# Patient Record
Sex: Female | Born: 1935 | Race: White | Hispanic: No | Marital: Married | Smoking: Former smoker
Health system: Southern US, Community
[De-identification: ages and names within clinical notes are randomized; demographics above are authoritative.]

## PROBLEM LIST (undated history)

## (undated) DIAGNOSIS — K5900 Constipation, unspecified: Secondary | ICD-10-CM

## (undated) DIAGNOSIS — R21 Rash and other nonspecific skin eruption: Secondary | ICD-10-CM

## (undated) DIAGNOSIS — I10 Essential (primary) hypertension: Secondary | ICD-10-CM

## (undated) DIAGNOSIS — L538 Other specified erythematous conditions: Secondary | ICD-10-CM

## (undated) DIAGNOSIS — M545 Low back pain: Secondary | ICD-10-CM

## (undated) DIAGNOSIS — R269 Unspecified abnormalities of gait and mobility: Principal | ICD-10-CM

## (undated) DIAGNOSIS — M1990 Unspecified osteoarthritis, unspecified site: Secondary | ICD-10-CM

## (undated) DIAGNOSIS — R413 Other amnesia: Secondary | ICD-10-CM

## (undated) DIAGNOSIS — M25519 Pain in unspecified shoulder: Secondary | ICD-10-CM

## (undated) DIAGNOSIS — F329 Major depressive disorder, single episode, unspecified: Secondary | ICD-10-CM

## (undated) DIAGNOSIS — J069 Acute upper respiratory infection, unspecified: Secondary | ICD-10-CM

## (undated) DIAGNOSIS — R32 Unspecified urinary incontinence: Secondary | ICD-10-CM

## (undated) DIAGNOSIS — F411 Generalized anxiety disorder: Secondary | ICD-10-CM

## (undated) DIAGNOSIS — M4800 Spinal stenosis, site unspecified: Secondary | ICD-10-CM

## (undated) DIAGNOSIS — E785 Hyperlipidemia, unspecified: Secondary | ICD-10-CM

## (undated) DIAGNOSIS — M549 Dorsalgia, unspecified: Secondary | ICD-10-CM

## (undated) DIAGNOSIS — R609 Edema, unspecified: Secondary | ICD-10-CM

## (undated) DIAGNOSIS — K210 Gastro-esophageal reflux disease with esophagitis: Secondary | ICD-10-CM

## (undated) DIAGNOSIS — E039 Hypothyroidism, unspecified: Secondary | ICD-10-CM

## (undated) HISTORY — PX: ROTATOR CUFF REPAIR: SHX139

## (undated) HISTORY — DX: Edema, unspecified: R60.9

## (undated) HISTORY — DX: Essential (primary) hypertension: I10

## (undated) HISTORY — DX: Spinal stenosis, site unspecified: M48.00

## (undated) HISTORY — DX: Unspecified abnormalities of gait and mobility: R26.9

## (undated) HISTORY — DX: Hyperlipidemia, unspecified: E78.5

## (undated) HISTORY — DX: Other specified erythematous conditions: L53.8

## (undated) HISTORY — DX: Low back pain: M54.5

## (undated) HISTORY — DX: Hypothyroidism, unspecified: E03.9

## (undated) HISTORY — DX: Gastro-esophageal reflux disease with esophagitis: K21.0

## (undated) HISTORY — DX: Generalized anxiety disorder: F41.1

## (undated) HISTORY — DX: Pain in unspecified shoulder: M25.519

## (undated) HISTORY — DX: Constipation, unspecified: K59.00

## (undated) HISTORY — PX: BACK SURGERY: SHX140

## (undated) HISTORY — DX: Unspecified osteoarthritis, unspecified site: M19.90

## (undated) HISTORY — DX: Unspecified urinary incontinence: R32

## (undated) HISTORY — DX: Rash and other nonspecific skin eruption: R21

## (undated) HISTORY — DX: Dorsalgia, unspecified: M54.9

## (undated) HISTORY — DX: Acute upper respiratory infection, unspecified: J06.9

## (undated) HISTORY — DX: Other amnesia: R41.3

## (undated) HISTORY — DX: Major depressive disorder, single episode, unspecified: F32.9

## (undated) HISTORY — PX: CATARACT EXTRACTION W/ INTRAOCULAR LENS  IMPLANT, BILATERAL: SHX1307

---

## 1970-03-15 HISTORY — PX: TOTAL ABDOMINAL HYSTERECTOMY W/ BILATERAL SALPINGOOPHORECTOMY: SHX83

## 2008-04-24 ENCOUNTER — Ambulatory Visit (HOSPITAL_COMMUNITY): Admission: RE | Admit: 2008-04-24 | Discharge: 2008-04-24 | Payer: Self-pay | Admitting: Internal Medicine

## 2008-12-25 ENCOUNTER — Encounter: Admission: RE | Admit: 2008-12-25 | Discharge: 2008-12-25 | Payer: Self-pay | Admitting: Internal Medicine

## 2009-01-27 ENCOUNTER — Ambulatory Visit (HOSPITAL_COMMUNITY): Admission: RE | Admit: 2009-01-27 | Discharge: 2009-01-27 | Payer: Self-pay | Admitting: Internal Medicine

## 2009-01-27 ENCOUNTER — Other Ambulatory Visit: Payer: Self-pay | Admitting: Internal Medicine

## 2009-12-05 ENCOUNTER — Encounter: Admission: RE | Admit: 2009-12-05 | Discharge: 2009-12-05 | Payer: Self-pay | Admitting: Internal Medicine

## 2009-12-10 ENCOUNTER — Encounter: Admission: RE | Admit: 2009-12-10 | Discharge: 2009-12-10 | Payer: Self-pay | Admitting: Internal Medicine

## 2010-01-27 ENCOUNTER — Encounter: Admission: RE | Admit: 2010-01-27 | Discharge: 2010-01-27 | Payer: Self-pay | Admitting: Neurosurgery

## 2010-03-20 ENCOUNTER — Encounter
Admission: RE | Admit: 2010-03-20 | Discharge: 2010-03-20 | Payer: Self-pay | Source: Home / Self Care | Attending: Internal Medicine | Admitting: Internal Medicine

## 2010-03-24 ENCOUNTER — Encounter
Admission: RE | Admit: 2010-03-24 | Discharge: 2010-03-24 | Payer: Self-pay | Source: Home / Self Care | Attending: Internal Medicine | Admitting: Internal Medicine

## 2010-06-17 LAB — BLOOD GAS, ARTERIAL
Drawn by: 242311
FIO2: 0.21 %
O2 Saturation: 96.2 %
Patient temperature: 98.6

## 2010-11-09 ENCOUNTER — Ambulatory Visit
Admission: RE | Admit: 2010-11-09 | Discharge: 2010-11-09 | Disposition: A | Discharge status: Home / Self Care | Payer: Medicare Other | Source: Ambulatory Visit | Attending: Cardiology | Admitting: Cardiology

## 2010-11-09 ENCOUNTER — Other Ambulatory Visit: Payer: Self-pay | Admitting: Cardiology

## 2010-11-09 DIAGNOSIS — R0989 Other specified symptoms and signs involving the circulatory and respiratory systems: Secondary | ICD-10-CM

## 2010-11-09 DIAGNOSIS — R0609 Other forms of dyspnea: Secondary | ICD-10-CM

## 2010-11-10 ENCOUNTER — Ambulatory Visit (INDEPENDENT_AMBULATORY_CARE_PROVIDER_SITE_OTHER): Payer: Medicare Other

## 2010-11-10 DIAGNOSIS — R0989 Other specified symptoms and signs involving the circulatory and respiratory systems: Secondary | ICD-10-CM

## 2010-11-10 DIAGNOSIS — R0609 Other forms of dyspnea: Secondary | ICD-10-CM

## 2010-11-13 NOTE — Procedures (Unsigned)
DUPLEX DEEP VENOUS EXAM - LOWER EXTREMITY  INDICATION:  Other dyspnea and respiratory abnormality.  HISTORY:  Edema:  Bilateral lower leg swelling/redness since 11/06/2010. Trauma/Surgery:  No. Pain:  Bilateral lower extremity tenderness. PE:  No. Previous DVT:  No. Anticoagulants:  No. Other:  DUPLEX EXAM:               CFV   SFV   PopV  PTV    GSV               R  L  R  L  R  L  R   L  R  L Thrombosis    o  o  o  o  o  o  o   o  o  o Spontaneous   +  +  +  +  +  +  +   +  +  + Phasic        +  +  +  +  +  +  +   +  +  + Augmentation  +  +  +  +  +  +  +   +  +  + Compressible  +  +  +  +  +  +  +   +  +  + Competent  Legend:  + - yes  o - no  p - partial  D - decreased  IMPRESSION: 1. No evidence of acute deep or superficial venous thrombosis noted in     the bilateral femoral-popliteal venous systems, based on decreased     visualization. 2. Unable to adequately visualize the bilateral calf veins due to     edema.  Preliminary findings stating the patency and compressibility of the visualized bilateral lower extremity veins were called to Dr. York Spaniel office by the sonographer and given to [facility] at 2 p.m. on 11/10/2010.   _____________________________ Janetta Hora. Fields, MD  CH/MEDQ  D:  11/11/2010  T:  11/11/2010  Job:  [id number]

## 2010-11-19 ENCOUNTER — Ambulatory Visit
Admission: RE | Admit: 2010-11-19 | Discharge: 2010-11-19 | Disposition: A | Discharge status: Home / Self Care | Payer: Medicare Other | Source: Ambulatory Visit | Attending: Internal Medicine | Admitting: Internal Medicine

## 2010-11-19 ENCOUNTER — Other Ambulatory Visit: Payer: PRIVATE HEALTH INSURANCE

## 2010-11-19 ENCOUNTER — Other Ambulatory Visit: Payer: Self-pay | Admitting: Internal Medicine

## 2010-11-19 DIAGNOSIS — R269 Unspecified abnormalities of gait and mobility: Secondary | ICD-10-CM

## 2010-11-19 DIAGNOSIS — R4182 Altered mental status, unspecified: Secondary | ICD-10-CM

## 2010-11-19 MED ORDER — IOHEXOL 300 MG/ML  SOLN
75.0000 mL | Freq: Once | INTRAMUSCULAR | Status: AC | PRN
  Administered 2010-11-19: 75 mL via INTRAVENOUS

## 2010-12-18 DIAGNOSIS — R413 Other amnesia: Secondary | ICD-10-CM

## 2010-12-18 HISTORY — DX: Other amnesia: R41.3

## 2011-03-16 DIAGNOSIS — Z9181 History of falling: Secondary | ICD-10-CM | POA: Diagnosis not present

## 2011-03-16 DIAGNOSIS — M6281 Muscle weakness (generalized): Secondary | ICD-10-CM | POA: Diagnosis not present

## 2011-03-16 DIAGNOSIS — R262 Difficulty in walking, not elsewhere classified: Secondary | ICD-10-CM | POA: Diagnosis not present

## 2011-03-16 DIAGNOSIS — R269 Unspecified abnormalities of gait and mobility: Secondary | ICD-10-CM | POA: Diagnosis not present

## 2011-03-16 DIAGNOSIS — R279 Unspecified lack of coordination: Secondary | ICD-10-CM | POA: Diagnosis not present

## 2011-03-17 DIAGNOSIS — F331 Major depressive disorder, recurrent, moderate: Secondary | ICD-10-CM | POA: Diagnosis not present

## 2011-03-17 DIAGNOSIS — R269 Unspecified abnormalities of gait and mobility: Secondary | ICD-10-CM | POA: Diagnosis not present

## 2011-03-17 DIAGNOSIS — M6281 Muscle weakness (generalized): Secondary | ICD-10-CM | POA: Diagnosis not present

## 2011-03-17 DIAGNOSIS — R279 Unspecified lack of coordination: Secondary | ICD-10-CM | POA: Diagnosis not present

## 2011-03-17 DIAGNOSIS — R262 Difficulty in walking, not elsewhere classified: Secondary | ICD-10-CM | POA: Diagnosis not present

## 2011-03-17 DIAGNOSIS — Z9181 History of falling: Secondary | ICD-10-CM | POA: Diagnosis not present

## 2011-03-18 DIAGNOSIS — M79609 Pain in unspecified limb: Secondary | ICD-10-CM | POA: Diagnosis not present

## 2011-03-18 DIAGNOSIS — B351 Tinea unguium: Secondary | ICD-10-CM | POA: Diagnosis not present

## 2011-03-19 DIAGNOSIS — Z9181 History of falling: Secondary | ICD-10-CM | POA: Diagnosis not present

## 2011-03-19 DIAGNOSIS — R279 Unspecified lack of coordination: Secondary | ICD-10-CM | POA: Diagnosis not present

## 2011-03-19 DIAGNOSIS — R262 Difficulty in walking, not elsewhere classified: Secondary | ICD-10-CM | POA: Diagnosis not present

## 2011-03-19 DIAGNOSIS — M6281 Muscle weakness (generalized): Secondary | ICD-10-CM | POA: Diagnosis not present

## 2011-03-19 DIAGNOSIS — R269 Unspecified abnormalities of gait and mobility: Secondary | ICD-10-CM | POA: Diagnosis not present

## 2011-03-22 DIAGNOSIS — R269 Unspecified abnormalities of gait and mobility: Secondary | ICD-10-CM | POA: Diagnosis not present

## 2011-03-22 DIAGNOSIS — Z9181 History of falling: Secondary | ICD-10-CM | POA: Diagnosis not present

## 2011-03-22 DIAGNOSIS — R279 Unspecified lack of coordination: Secondary | ICD-10-CM | POA: Diagnosis not present

## 2011-03-22 DIAGNOSIS — M6281 Muscle weakness (generalized): Secondary | ICD-10-CM | POA: Diagnosis not present

## 2011-03-22 DIAGNOSIS — R262 Difficulty in walking, not elsewhere classified: Secondary | ICD-10-CM | POA: Diagnosis not present

## 2011-03-23 DIAGNOSIS — Z9181 History of falling: Secondary | ICD-10-CM | POA: Diagnosis not present

## 2011-03-23 DIAGNOSIS — M6281 Muscle weakness (generalized): Secondary | ICD-10-CM | POA: Diagnosis not present

## 2011-03-23 DIAGNOSIS — R269 Unspecified abnormalities of gait and mobility: Secondary | ICD-10-CM | POA: Diagnosis not present

## 2011-03-23 DIAGNOSIS — R262 Difficulty in walking, not elsewhere classified: Secondary | ICD-10-CM | POA: Diagnosis not present

## 2011-03-23 DIAGNOSIS — R279 Unspecified lack of coordination: Secondary | ICD-10-CM | POA: Diagnosis not present

## 2011-03-24 DIAGNOSIS — R262 Difficulty in walking, not elsewhere classified: Secondary | ICD-10-CM | POA: Diagnosis not present

## 2011-03-24 DIAGNOSIS — R279 Unspecified lack of coordination: Secondary | ICD-10-CM | POA: Diagnosis not present

## 2011-03-24 DIAGNOSIS — Z9181 History of falling: Secondary | ICD-10-CM | POA: Diagnosis not present

## 2011-03-24 DIAGNOSIS — R269 Unspecified abnormalities of gait and mobility: Secondary | ICD-10-CM | POA: Diagnosis not present

## 2011-03-24 DIAGNOSIS — M6281 Muscle weakness (generalized): Secondary | ICD-10-CM | POA: Diagnosis not present

## 2011-03-25 DIAGNOSIS — R279 Unspecified lack of coordination: Secondary | ICD-10-CM | POA: Diagnosis not present

## 2011-03-25 DIAGNOSIS — M6281 Muscle weakness (generalized): Secondary | ICD-10-CM | POA: Diagnosis not present

## 2011-03-25 DIAGNOSIS — R262 Difficulty in walking, not elsewhere classified: Secondary | ICD-10-CM | POA: Diagnosis not present

## 2011-03-25 DIAGNOSIS — R269 Unspecified abnormalities of gait and mobility: Secondary | ICD-10-CM | POA: Diagnosis not present

## 2011-03-25 DIAGNOSIS — Z9181 History of falling: Secondary | ICD-10-CM | POA: Diagnosis not present

## 2011-03-29 DIAGNOSIS — F331 Major depressive disorder, recurrent, moderate: Secondary | ICD-10-CM | POA: Diagnosis not present

## 2011-03-31 DIAGNOSIS — Z9181 History of falling: Secondary | ICD-10-CM | POA: Diagnosis not present

## 2011-03-31 DIAGNOSIS — M6281 Muscle weakness (generalized): Secondary | ICD-10-CM | POA: Diagnosis not present

## 2011-03-31 DIAGNOSIS — R262 Difficulty in walking, not elsewhere classified: Secondary | ICD-10-CM | POA: Diagnosis not present

## 2011-03-31 DIAGNOSIS — R279 Unspecified lack of coordination: Secondary | ICD-10-CM | POA: Diagnosis not present

## 2011-03-31 DIAGNOSIS — R269 Unspecified abnormalities of gait and mobility: Secondary | ICD-10-CM | POA: Diagnosis not present

## 2011-04-01 DIAGNOSIS — R279 Unspecified lack of coordination: Secondary | ICD-10-CM | POA: Diagnosis not present

## 2011-04-01 DIAGNOSIS — R269 Unspecified abnormalities of gait and mobility: Secondary | ICD-10-CM | POA: Diagnosis not present

## 2011-04-01 DIAGNOSIS — M6281 Muscle weakness (generalized): Secondary | ICD-10-CM | POA: Diagnosis not present

## 2011-04-01 DIAGNOSIS — Z9181 History of falling: Secondary | ICD-10-CM | POA: Diagnosis not present

## 2011-04-01 DIAGNOSIS — R262 Difficulty in walking, not elsewhere classified: Secondary | ICD-10-CM | POA: Diagnosis not present

## 2011-04-05 DIAGNOSIS — R269 Unspecified abnormalities of gait and mobility: Secondary | ICD-10-CM | POA: Diagnosis not present

## 2011-04-05 DIAGNOSIS — Z9181 History of falling: Secondary | ICD-10-CM | POA: Diagnosis not present

## 2011-04-05 DIAGNOSIS — R262 Difficulty in walking, not elsewhere classified: Secondary | ICD-10-CM | POA: Diagnosis not present

## 2011-04-05 DIAGNOSIS — M6281 Muscle weakness (generalized): Secondary | ICD-10-CM | POA: Diagnosis not present

## 2011-04-05 DIAGNOSIS — R279 Unspecified lack of coordination: Secondary | ICD-10-CM | POA: Diagnosis not present

## 2011-04-06 DIAGNOSIS — R262 Difficulty in walking, not elsewhere classified: Secondary | ICD-10-CM | POA: Diagnosis not present

## 2011-04-06 DIAGNOSIS — R269 Unspecified abnormalities of gait and mobility: Secondary | ICD-10-CM | POA: Diagnosis not present

## 2011-04-06 DIAGNOSIS — Z9181 History of falling: Secondary | ICD-10-CM | POA: Diagnosis not present

## 2011-04-06 DIAGNOSIS — M6281 Muscle weakness (generalized): Secondary | ICD-10-CM | POA: Diagnosis not present

## 2011-04-06 DIAGNOSIS — R279 Unspecified lack of coordination: Secondary | ICD-10-CM | POA: Diagnosis not present

## 2011-04-07 DIAGNOSIS — R269 Unspecified abnormalities of gait and mobility: Secondary | ICD-10-CM | POA: Diagnosis not present

## 2011-04-07 DIAGNOSIS — Z9181 History of falling: Secondary | ICD-10-CM | POA: Diagnosis not present

## 2011-04-07 DIAGNOSIS — M6281 Muscle weakness (generalized): Secondary | ICD-10-CM | POA: Diagnosis not present

## 2011-04-07 DIAGNOSIS — R279 Unspecified lack of coordination: Secondary | ICD-10-CM | POA: Diagnosis not present

## 2011-04-07 DIAGNOSIS — R262 Difficulty in walking, not elsewhere classified: Secondary | ICD-10-CM | POA: Diagnosis not present

## 2011-04-09 DIAGNOSIS — M6281 Muscle weakness (generalized): Secondary | ICD-10-CM | POA: Diagnosis not present

## 2011-04-09 DIAGNOSIS — R269 Unspecified abnormalities of gait and mobility: Secondary | ICD-10-CM | POA: Diagnosis not present

## 2011-04-09 DIAGNOSIS — R262 Difficulty in walking, not elsewhere classified: Secondary | ICD-10-CM | POA: Diagnosis not present

## 2011-04-09 DIAGNOSIS — Z9181 History of falling: Secondary | ICD-10-CM | POA: Diagnosis not present

## 2011-04-09 DIAGNOSIS — R279 Unspecified lack of coordination: Secondary | ICD-10-CM | POA: Diagnosis not present

## 2011-04-12 DIAGNOSIS — Z9181 History of falling: Secondary | ICD-10-CM | POA: Diagnosis not present

## 2011-04-12 DIAGNOSIS — R262 Difficulty in walking, not elsewhere classified: Secondary | ICD-10-CM | POA: Diagnosis not present

## 2011-04-12 DIAGNOSIS — R269 Unspecified abnormalities of gait and mobility: Secondary | ICD-10-CM | POA: Diagnosis not present

## 2011-04-12 DIAGNOSIS — M6281 Muscle weakness (generalized): Secondary | ICD-10-CM | POA: Diagnosis not present

## 2011-04-12 DIAGNOSIS — R279 Unspecified lack of coordination: Secondary | ICD-10-CM | POA: Diagnosis not present

## 2011-04-13 DIAGNOSIS — R269 Unspecified abnormalities of gait and mobility: Secondary | ICD-10-CM | POA: Diagnosis not present

## 2011-04-13 DIAGNOSIS — R279 Unspecified lack of coordination: Secondary | ICD-10-CM | POA: Diagnosis not present

## 2011-04-13 DIAGNOSIS — R262 Difficulty in walking, not elsewhere classified: Secondary | ICD-10-CM | POA: Diagnosis not present

## 2011-04-13 DIAGNOSIS — M6281 Muscle weakness (generalized): Secondary | ICD-10-CM | POA: Diagnosis not present

## 2011-04-13 DIAGNOSIS — Z9181 History of falling: Secondary | ICD-10-CM | POA: Diagnosis not present

## 2011-04-14 DIAGNOSIS — F331 Major depressive disorder, recurrent, moderate: Secondary | ICD-10-CM | POA: Diagnosis not present

## 2011-04-14 DIAGNOSIS — F411 Generalized anxiety disorder: Secondary | ICD-10-CM | POA: Diagnosis not present

## 2011-04-15 DIAGNOSIS — R262 Difficulty in walking, not elsewhere classified: Secondary | ICD-10-CM | POA: Diagnosis not present

## 2011-04-15 DIAGNOSIS — R269 Unspecified abnormalities of gait and mobility: Secondary | ICD-10-CM | POA: Diagnosis not present

## 2011-04-15 DIAGNOSIS — R279 Unspecified lack of coordination: Secondary | ICD-10-CM | POA: Diagnosis not present

## 2011-04-15 DIAGNOSIS — Z9181 History of falling: Secondary | ICD-10-CM | POA: Diagnosis not present

## 2011-04-15 DIAGNOSIS — M6281 Muscle weakness (generalized): Secondary | ICD-10-CM | POA: Diagnosis not present

## 2011-04-16 DIAGNOSIS — M6281 Muscle weakness (generalized): Secondary | ICD-10-CM | POA: Diagnosis not present

## 2011-04-16 DIAGNOSIS — R262 Difficulty in walking, not elsewhere classified: Secondary | ICD-10-CM | POA: Diagnosis not present

## 2011-04-16 DIAGNOSIS — R269 Unspecified abnormalities of gait and mobility: Secondary | ICD-10-CM | POA: Diagnosis not present

## 2011-04-16 DIAGNOSIS — R279 Unspecified lack of coordination: Secondary | ICD-10-CM | POA: Diagnosis not present

## 2011-04-16 DIAGNOSIS — Z9181 History of falling: Secondary | ICD-10-CM | POA: Diagnosis not present

## 2011-04-19 DIAGNOSIS — Z9181 History of falling: Secondary | ICD-10-CM | POA: Diagnosis not present

## 2011-04-19 DIAGNOSIS — R269 Unspecified abnormalities of gait and mobility: Secondary | ICD-10-CM | POA: Diagnosis not present

## 2011-04-19 DIAGNOSIS — R262 Difficulty in walking, not elsewhere classified: Secondary | ICD-10-CM | POA: Diagnosis not present

## 2011-04-19 DIAGNOSIS — M6281 Muscle weakness (generalized): Secondary | ICD-10-CM | POA: Diagnosis not present

## 2011-04-19 DIAGNOSIS — R279 Unspecified lack of coordination: Secondary | ICD-10-CM | POA: Diagnosis not present

## 2011-04-20 DIAGNOSIS — R269 Unspecified abnormalities of gait and mobility: Secondary | ICD-10-CM | POA: Diagnosis not present

## 2011-04-20 DIAGNOSIS — R279 Unspecified lack of coordination: Secondary | ICD-10-CM | POA: Diagnosis not present

## 2011-04-20 DIAGNOSIS — M6281 Muscle weakness (generalized): Secondary | ICD-10-CM | POA: Diagnosis not present

## 2011-04-20 DIAGNOSIS — Z9181 History of falling: Secondary | ICD-10-CM | POA: Diagnosis not present

## 2011-04-20 DIAGNOSIS — R262 Difficulty in walking, not elsewhere classified: Secondary | ICD-10-CM | POA: Diagnosis not present

## 2011-04-22 DIAGNOSIS — R269 Unspecified abnormalities of gait and mobility: Secondary | ICD-10-CM | POA: Diagnosis not present

## 2011-04-22 DIAGNOSIS — Z9181 History of falling: Secondary | ICD-10-CM | POA: Diagnosis not present

## 2011-04-22 DIAGNOSIS — M6281 Muscle weakness (generalized): Secondary | ICD-10-CM | POA: Diagnosis not present

## 2011-04-22 DIAGNOSIS — R262 Difficulty in walking, not elsewhere classified: Secondary | ICD-10-CM | POA: Diagnosis not present

## 2011-04-22 DIAGNOSIS — R279 Unspecified lack of coordination: Secondary | ICD-10-CM | POA: Diagnosis not present

## 2011-04-27 DIAGNOSIS — R262 Difficulty in walking, not elsewhere classified: Secondary | ICD-10-CM | POA: Diagnosis not present

## 2011-04-27 DIAGNOSIS — M6281 Muscle weakness (generalized): Secondary | ICD-10-CM | POA: Diagnosis not present

## 2011-04-27 DIAGNOSIS — R279 Unspecified lack of coordination: Secondary | ICD-10-CM | POA: Diagnosis not present

## 2011-04-27 DIAGNOSIS — R269 Unspecified abnormalities of gait and mobility: Secondary | ICD-10-CM | POA: Diagnosis not present

## 2011-04-27 DIAGNOSIS — Z9181 History of falling: Secondary | ICD-10-CM | POA: Diagnosis not present

## 2011-04-28 DIAGNOSIS — R262 Difficulty in walking, not elsewhere classified: Secondary | ICD-10-CM | POA: Diagnosis not present

## 2011-04-28 DIAGNOSIS — Z9181 History of falling: Secondary | ICD-10-CM | POA: Diagnosis not present

## 2011-04-28 DIAGNOSIS — F331 Major depressive disorder, recurrent, moderate: Secondary | ICD-10-CM | POA: Diagnosis not present

## 2011-04-28 DIAGNOSIS — R269 Unspecified abnormalities of gait and mobility: Secondary | ICD-10-CM | POA: Diagnosis not present

## 2011-04-28 DIAGNOSIS — F411 Generalized anxiety disorder: Secondary | ICD-10-CM | POA: Diagnosis not present

## 2011-04-28 DIAGNOSIS — M6281 Muscle weakness (generalized): Secondary | ICD-10-CM | POA: Diagnosis not present

## 2011-04-28 DIAGNOSIS — R279 Unspecified lack of coordination: Secondary | ICD-10-CM | POA: Diagnosis not present

## 2011-04-29 DIAGNOSIS — M6281 Muscle weakness (generalized): Secondary | ICD-10-CM | POA: Diagnosis not present

## 2011-04-29 DIAGNOSIS — R262 Difficulty in walking, not elsewhere classified: Secondary | ICD-10-CM | POA: Diagnosis not present

## 2011-04-29 DIAGNOSIS — R279 Unspecified lack of coordination: Secondary | ICD-10-CM | POA: Diagnosis not present

## 2011-04-29 DIAGNOSIS — R269 Unspecified abnormalities of gait and mobility: Secondary | ICD-10-CM | POA: Diagnosis not present

## 2011-04-29 DIAGNOSIS — Z9181 History of falling: Secondary | ICD-10-CM | POA: Diagnosis not present

## 2011-04-30 DIAGNOSIS — Z9181 History of falling: Secondary | ICD-10-CM | POA: Diagnosis not present

## 2011-04-30 DIAGNOSIS — R279 Unspecified lack of coordination: Secondary | ICD-10-CM | POA: Diagnosis not present

## 2011-04-30 DIAGNOSIS — M6281 Muscle weakness (generalized): Secondary | ICD-10-CM | POA: Diagnosis not present

## 2011-04-30 DIAGNOSIS — R269 Unspecified abnormalities of gait and mobility: Secondary | ICD-10-CM | POA: Diagnosis not present

## 2011-04-30 DIAGNOSIS — R262 Difficulty in walking, not elsewhere classified: Secondary | ICD-10-CM | POA: Diagnosis not present

## 2011-05-03 DIAGNOSIS — R269 Unspecified abnormalities of gait and mobility: Secondary | ICD-10-CM | POA: Diagnosis not present

## 2011-05-03 DIAGNOSIS — R262 Difficulty in walking, not elsewhere classified: Secondary | ICD-10-CM | POA: Diagnosis not present

## 2011-05-03 DIAGNOSIS — M6281 Muscle weakness (generalized): Secondary | ICD-10-CM | POA: Diagnosis not present

## 2011-05-03 DIAGNOSIS — Z9181 History of falling: Secondary | ICD-10-CM | POA: Diagnosis not present

## 2011-05-03 DIAGNOSIS — R279 Unspecified lack of coordination: Secondary | ICD-10-CM | POA: Diagnosis not present

## 2011-05-05 DIAGNOSIS — M6281 Muscle weakness (generalized): Secondary | ICD-10-CM | POA: Diagnosis not present

## 2011-05-05 DIAGNOSIS — R269 Unspecified abnormalities of gait and mobility: Secondary | ICD-10-CM | POA: Diagnosis not present

## 2011-05-05 DIAGNOSIS — Z9181 History of falling: Secondary | ICD-10-CM | POA: Diagnosis not present

## 2011-05-05 DIAGNOSIS — R262 Difficulty in walking, not elsewhere classified: Secondary | ICD-10-CM | POA: Diagnosis not present

## 2011-05-05 DIAGNOSIS — R279 Unspecified lack of coordination: Secondary | ICD-10-CM | POA: Diagnosis not present

## 2011-05-06 DIAGNOSIS — M6281 Muscle weakness (generalized): Secondary | ICD-10-CM | POA: Diagnosis not present

## 2011-05-06 DIAGNOSIS — R279 Unspecified lack of coordination: Secondary | ICD-10-CM | POA: Diagnosis not present

## 2011-05-06 DIAGNOSIS — R269 Unspecified abnormalities of gait and mobility: Secondary | ICD-10-CM | POA: Diagnosis not present

## 2011-05-06 DIAGNOSIS — R262 Difficulty in walking, not elsewhere classified: Secondary | ICD-10-CM | POA: Diagnosis not present

## 2011-05-06 DIAGNOSIS — Z9181 History of falling: Secondary | ICD-10-CM | POA: Diagnosis not present

## 2011-05-07 DIAGNOSIS — R279 Unspecified lack of coordination: Secondary | ICD-10-CM | POA: Diagnosis not present

## 2011-05-07 DIAGNOSIS — M6281 Muscle weakness (generalized): Secondary | ICD-10-CM | POA: Diagnosis not present

## 2011-05-07 DIAGNOSIS — Z9181 History of falling: Secondary | ICD-10-CM | POA: Diagnosis not present

## 2011-05-07 DIAGNOSIS — R262 Difficulty in walking, not elsewhere classified: Secondary | ICD-10-CM | POA: Diagnosis not present

## 2011-05-07 DIAGNOSIS — R269 Unspecified abnormalities of gait and mobility: Secondary | ICD-10-CM | POA: Diagnosis not present

## 2011-05-10 DIAGNOSIS — M6281 Muscle weakness (generalized): Secondary | ICD-10-CM | POA: Diagnosis not present

## 2011-05-10 DIAGNOSIS — R269 Unspecified abnormalities of gait and mobility: Secondary | ICD-10-CM | POA: Diagnosis not present

## 2011-05-10 DIAGNOSIS — Z9181 History of falling: Secondary | ICD-10-CM | POA: Diagnosis not present

## 2011-05-10 DIAGNOSIS — R279 Unspecified lack of coordination: Secondary | ICD-10-CM | POA: Diagnosis not present

## 2011-05-10 DIAGNOSIS — R262 Difficulty in walking, not elsewhere classified: Secondary | ICD-10-CM | POA: Diagnosis not present

## 2011-05-11 DIAGNOSIS — R269 Unspecified abnormalities of gait and mobility: Secondary | ICD-10-CM | POA: Diagnosis not present

## 2011-05-11 DIAGNOSIS — R279 Unspecified lack of coordination: Secondary | ICD-10-CM | POA: Diagnosis not present

## 2011-05-11 DIAGNOSIS — Z9181 History of falling: Secondary | ICD-10-CM | POA: Diagnosis not present

## 2011-05-11 DIAGNOSIS — M6281 Muscle weakness (generalized): Secondary | ICD-10-CM | POA: Diagnosis not present

## 2011-05-11 DIAGNOSIS — R262 Difficulty in walking, not elsewhere classified: Secondary | ICD-10-CM | POA: Diagnosis not present

## 2011-05-12 DIAGNOSIS — R262 Difficulty in walking, not elsewhere classified: Secondary | ICD-10-CM | POA: Diagnosis not present

## 2011-05-12 DIAGNOSIS — Z9181 History of falling: Secondary | ICD-10-CM | POA: Diagnosis not present

## 2011-05-12 DIAGNOSIS — R279 Unspecified lack of coordination: Secondary | ICD-10-CM | POA: Diagnosis not present

## 2011-05-12 DIAGNOSIS — M6281 Muscle weakness (generalized): Secondary | ICD-10-CM | POA: Diagnosis not present

## 2011-05-12 DIAGNOSIS — R269 Unspecified abnormalities of gait and mobility: Secondary | ICD-10-CM | POA: Diagnosis not present

## 2011-05-13 DIAGNOSIS — R279 Unspecified lack of coordination: Secondary | ICD-10-CM | POA: Diagnosis not present

## 2011-05-13 DIAGNOSIS — R262 Difficulty in walking, not elsewhere classified: Secondary | ICD-10-CM | POA: Diagnosis not present

## 2011-05-13 DIAGNOSIS — Z9181 History of falling: Secondary | ICD-10-CM | POA: Diagnosis not present

## 2011-05-13 DIAGNOSIS — M6281 Muscle weakness (generalized): Secondary | ICD-10-CM | POA: Diagnosis not present

## 2011-05-13 DIAGNOSIS — R269 Unspecified abnormalities of gait and mobility: Secondary | ICD-10-CM | POA: Diagnosis not present

## 2011-05-18 DIAGNOSIS — M6281 Muscle weakness (generalized): Secondary | ICD-10-CM | POA: Diagnosis not present

## 2011-05-18 DIAGNOSIS — R279 Unspecified lack of coordination: Secondary | ICD-10-CM | POA: Diagnosis not present

## 2011-05-18 DIAGNOSIS — Z9181 History of falling: Secondary | ICD-10-CM | POA: Diagnosis not present

## 2011-05-18 DIAGNOSIS — R262 Difficulty in walking, not elsewhere classified: Secondary | ICD-10-CM | POA: Diagnosis not present

## 2011-05-18 DIAGNOSIS — R269 Unspecified abnormalities of gait and mobility: Secondary | ICD-10-CM | POA: Diagnosis not present

## 2011-05-19 DIAGNOSIS — Z9181 History of falling: Secondary | ICD-10-CM | POA: Diagnosis not present

## 2011-05-19 DIAGNOSIS — R279 Unspecified lack of coordination: Secondary | ICD-10-CM | POA: Diagnosis not present

## 2011-05-19 DIAGNOSIS — F411 Generalized anxiety disorder: Secondary | ICD-10-CM | POA: Diagnosis not present

## 2011-05-19 DIAGNOSIS — M6281 Muscle weakness (generalized): Secondary | ICD-10-CM | POA: Diagnosis not present

## 2011-05-19 DIAGNOSIS — F331 Major depressive disorder, recurrent, moderate: Secondary | ICD-10-CM | POA: Diagnosis not present

## 2011-05-19 DIAGNOSIS — R269 Unspecified abnormalities of gait and mobility: Secondary | ICD-10-CM | POA: Diagnosis not present

## 2011-05-19 DIAGNOSIS — R262 Difficulty in walking, not elsewhere classified: Secondary | ICD-10-CM | POA: Diagnosis not present

## 2011-05-21 DIAGNOSIS — R262 Difficulty in walking, not elsewhere classified: Secondary | ICD-10-CM | POA: Diagnosis not present

## 2011-05-21 DIAGNOSIS — R279 Unspecified lack of coordination: Secondary | ICD-10-CM | POA: Diagnosis not present

## 2011-05-21 DIAGNOSIS — M6281 Muscle weakness (generalized): Secondary | ICD-10-CM | POA: Diagnosis not present

## 2011-05-21 DIAGNOSIS — R269 Unspecified abnormalities of gait and mobility: Secondary | ICD-10-CM | POA: Diagnosis not present

## 2011-05-21 DIAGNOSIS — Z9181 History of falling: Secondary | ICD-10-CM | POA: Diagnosis not present

## 2011-05-26 DIAGNOSIS — R269 Unspecified abnormalities of gait and mobility: Secondary | ICD-10-CM | POA: Diagnosis not present

## 2011-05-26 DIAGNOSIS — M6281 Muscle weakness (generalized): Secondary | ICD-10-CM | POA: Diagnosis not present

## 2011-05-26 DIAGNOSIS — Z9181 History of falling: Secondary | ICD-10-CM | POA: Diagnosis not present

## 2011-05-26 DIAGNOSIS — R262 Difficulty in walking, not elsewhere classified: Secondary | ICD-10-CM | POA: Diagnosis not present

## 2011-05-26 DIAGNOSIS — R279 Unspecified lack of coordination: Secondary | ICD-10-CM | POA: Diagnosis not present

## 2011-05-27 DIAGNOSIS — R262 Difficulty in walking, not elsewhere classified: Secondary | ICD-10-CM | POA: Diagnosis not present

## 2011-05-27 DIAGNOSIS — M6281 Muscle weakness (generalized): Secondary | ICD-10-CM | POA: Diagnosis not present

## 2011-05-27 DIAGNOSIS — Z9181 History of falling: Secondary | ICD-10-CM | POA: Diagnosis not present

## 2011-05-27 DIAGNOSIS — R279 Unspecified lack of coordination: Secondary | ICD-10-CM | POA: Diagnosis not present

## 2011-05-27 DIAGNOSIS — R269 Unspecified abnormalities of gait and mobility: Secondary | ICD-10-CM | POA: Diagnosis not present

## 2011-05-28 DIAGNOSIS — R262 Difficulty in walking, not elsewhere classified: Secondary | ICD-10-CM | POA: Diagnosis not present

## 2011-05-28 DIAGNOSIS — M6281 Muscle weakness (generalized): Secondary | ICD-10-CM | POA: Diagnosis not present

## 2011-05-28 DIAGNOSIS — R269 Unspecified abnormalities of gait and mobility: Secondary | ICD-10-CM | POA: Diagnosis not present

## 2011-05-28 DIAGNOSIS — Z9181 History of falling: Secondary | ICD-10-CM | POA: Diagnosis not present

## 2011-05-28 DIAGNOSIS — R279 Unspecified lack of coordination: Secondary | ICD-10-CM | POA: Diagnosis not present

## 2011-05-31 DIAGNOSIS — M6281 Muscle weakness (generalized): Secondary | ICD-10-CM | POA: Diagnosis not present

## 2011-05-31 DIAGNOSIS — R279 Unspecified lack of coordination: Secondary | ICD-10-CM | POA: Diagnosis not present

## 2011-05-31 DIAGNOSIS — R269 Unspecified abnormalities of gait and mobility: Secondary | ICD-10-CM | POA: Diagnosis not present

## 2011-05-31 DIAGNOSIS — Z9181 History of falling: Secondary | ICD-10-CM | POA: Diagnosis not present

## 2011-05-31 DIAGNOSIS — R262 Difficulty in walking, not elsewhere classified: Secondary | ICD-10-CM | POA: Diagnosis not present

## 2011-06-01 DIAGNOSIS — M6281 Muscle weakness (generalized): Secondary | ICD-10-CM | POA: Diagnosis not present

## 2011-06-01 DIAGNOSIS — R279 Unspecified lack of coordination: Secondary | ICD-10-CM | POA: Diagnosis not present

## 2011-06-01 DIAGNOSIS — R262 Difficulty in walking, not elsewhere classified: Secondary | ICD-10-CM | POA: Diagnosis not present

## 2011-06-01 DIAGNOSIS — R269 Unspecified abnormalities of gait and mobility: Secondary | ICD-10-CM | POA: Diagnosis not present

## 2011-06-01 DIAGNOSIS — Z9181 History of falling: Secondary | ICD-10-CM | POA: Diagnosis not present

## 2011-06-02 DIAGNOSIS — R262 Difficulty in walking, not elsewhere classified: Secondary | ICD-10-CM | POA: Diagnosis not present

## 2011-06-02 DIAGNOSIS — R279 Unspecified lack of coordination: Secondary | ICD-10-CM | POA: Diagnosis not present

## 2011-06-02 DIAGNOSIS — Z9181 History of falling: Secondary | ICD-10-CM | POA: Diagnosis not present

## 2011-06-02 DIAGNOSIS — R269 Unspecified abnormalities of gait and mobility: Secondary | ICD-10-CM | POA: Diagnosis not present

## 2011-06-02 DIAGNOSIS — M6281 Muscle weakness (generalized): Secondary | ICD-10-CM | POA: Diagnosis not present

## 2011-06-03 DIAGNOSIS — B351 Tinea unguium: Secondary | ICD-10-CM | POA: Diagnosis not present

## 2011-06-03 DIAGNOSIS — M79609 Pain in unspecified limb: Secondary | ICD-10-CM | POA: Diagnosis not present

## 2011-06-04 DIAGNOSIS — R262 Difficulty in walking, not elsewhere classified: Secondary | ICD-10-CM | POA: Diagnosis not present

## 2011-06-04 DIAGNOSIS — R279 Unspecified lack of coordination: Secondary | ICD-10-CM | POA: Diagnosis not present

## 2011-06-04 DIAGNOSIS — M6281 Muscle weakness (generalized): Secondary | ICD-10-CM | POA: Diagnosis not present

## 2011-06-04 DIAGNOSIS — Z9181 History of falling: Secondary | ICD-10-CM | POA: Diagnosis not present

## 2011-06-04 DIAGNOSIS — R269 Unspecified abnormalities of gait and mobility: Secondary | ICD-10-CM | POA: Diagnosis not present

## 2011-06-16 DIAGNOSIS — Z1231 Encounter for screening mammogram for malignant neoplasm of breast: Secondary | ICD-10-CM | POA: Diagnosis not present

## 2011-06-17 DIAGNOSIS — Z9181 History of falling: Secondary | ICD-10-CM | POA: Diagnosis not present

## 2011-06-17 DIAGNOSIS — R279 Unspecified lack of coordination: Secondary | ICD-10-CM | POA: Diagnosis not present

## 2011-06-17 DIAGNOSIS — R262 Difficulty in walking, not elsewhere classified: Secondary | ICD-10-CM | POA: Diagnosis not present

## 2011-06-17 DIAGNOSIS — M6281 Muscle weakness (generalized): Secondary | ICD-10-CM | POA: Diagnosis not present

## 2011-06-17 DIAGNOSIS — R269 Unspecified abnormalities of gait and mobility: Secondary | ICD-10-CM | POA: Diagnosis not present

## 2011-06-18 DIAGNOSIS — M549 Dorsalgia, unspecified: Secondary | ICD-10-CM | POA: Diagnosis not present

## 2011-06-18 DIAGNOSIS — I10 Essential (primary) hypertension: Secondary | ICD-10-CM | POA: Diagnosis not present

## 2011-06-18 DIAGNOSIS — Z8719 Personal history of other diseases of the digestive system: Secondary | ICD-10-CM | POA: Diagnosis not present

## 2011-06-18 DIAGNOSIS — F329 Major depressive disorder, single episode, unspecified: Secondary | ICD-10-CM | POA: Diagnosis not present

## 2011-06-23 DIAGNOSIS — M6281 Muscle weakness (generalized): Secondary | ICD-10-CM | POA: Diagnosis not present

## 2011-06-23 DIAGNOSIS — Z9181 History of falling: Secondary | ICD-10-CM | POA: Diagnosis not present

## 2011-06-23 DIAGNOSIS — R279 Unspecified lack of coordination: Secondary | ICD-10-CM | POA: Diagnosis not present

## 2011-06-23 DIAGNOSIS — R269 Unspecified abnormalities of gait and mobility: Secondary | ICD-10-CM | POA: Diagnosis not present

## 2011-06-23 DIAGNOSIS — R262 Difficulty in walking, not elsewhere classified: Secondary | ICD-10-CM | POA: Diagnosis not present

## 2011-06-24 DIAGNOSIS — Z9181 History of falling: Secondary | ICD-10-CM | POA: Diagnosis not present

## 2011-06-24 DIAGNOSIS — R279 Unspecified lack of coordination: Secondary | ICD-10-CM | POA: Diagnosis not present

## 2011-06-24 DIAGNOSIS — R262 Difficulty in walking, not elsewhere classified: Secondary | ICD-10-CM | POA: Diagnosis not present

## 2011-06-24 DIAGNOSIS — M6281 Muscle weakness (generalized): Secondary | ICD-10-CM | POA: Diagnosis not present

## 2011-06-24 DIAGNOSIS — R269 Unspecified abnormalities of gait and mobility: Secondary | ICD-10-CM | POA: Diagnosis not present

## 2011-07-01 DIAGNOSIS — R279 Unspecified lack of coordination: Secondary | ICD-10-CM | POA: Diagnosis not present

## 2011-07-01 DIAGNOSIS — R269 Unspecified abnormalities of gait and mobility: Secondary | ICD-10-CM | POA: Diagnosis not present

## 2011-07-01 DIAGNOSIS — M6281 Muscle weakness (generalized): Secondary | ICD-10-CM | POA: Diagnosis not present

## 2011-07-01 DIAGNOSIS — Z9181 History of falling: Secondary | ICD-10-CM | POA: Diagnosis not present

## 2011-07-01 DIAGNOSIS — R262 Difficulty in walking, not elsewhere classified: Secondary | ICD-10-CM | POA: Diagnosis not present

## 2011-07-12 DIAGNOSIS — M6281 Muscle weakness (generalized): Secondary | ICD-10-CM | POA: Diagnosis not present

## 2011-07-12 DIAGNOSIS — R269 Unspecified abnormalities of gait and mobility: Secondary | ICD-10-CM | POA: Diagnosis not present

## 2011-07-12 DIAGNOSIS — R279 Unspecified lack of coordination: Secondary | ICD-10-CM | POA: Diagnosis not present

## 2011-07-12 DIAGNOSIS — R262 Difficulty in walking, not elsewhere classified: Secondary | ICD-10-CM | POA: Diagnosis not present

## 2011-07-12 DIAGNOSIS — Z9181 History of falling: Secondary | ICD-10-CM | POA: Diagnosis not present

## 2011-07-15 DIAGNOSIS — M6281 Muscle weakness (generalized): Secondary | ICD-10-CM | POA: Diagnosis not present

## 2011-07-15 DIAGNOSIS — R269 Unspecified abnormalities of gait and mobility: Secondary | ICD-10-CM | POA: Diagnosis not present

## 2011-07-15 DIAGNOSIS — R262 Difficulty in walking, not elsewhere classified: Secondary | ICD-10-CM | POA: Diagnosis not present

## 2011-07-15 DIAGNOSIS — Z9181 History of falling: Secondary | ICD-10-CM | POA: Diagnosis not present

## 2011-07-15 DIAGNOSIS — R279 Unspecified lack of coordination: Secondary | ICD-10-CM | POA: Diagnosis not present

## 2011-07-16 DIAGNOSIS — D239 Other benign neoplasm of skin, unspecified: Secondary | ICD-10-CM | POA: Diagnosis not present

## 2011-07-16 DIAGNOSIS — L538 Other specified erythematous conditions: Secondary | ICD-10-CM | POA: Diagnosis not present

## 2011-07-16 DIAGNOSIS — L819 Disorder of pigmentation, unspecified: Secondary | ICD-10-CM | POA: Diagnosis not present

## 2011-07-16 DIAGNOSIS — D1801 Hemangioma of skin and subcutaneous tissue: Secondary | ICD-10-CM | POA: Diagnosis not present

## 2011-09-06 DIAGNOSIS — B351 Tinea unguium: Secondary | ICD-10-CM | POA: Diagnosis not present

## 2011-09-06 DIAGNOSIS — M79609 Pain in unspecified limb: Secondary | ICD-10-CM | POA: Diagnosis not present

## 2011-09-24 DIAGNOSIS — Z9181 History of falling: Secondary | ICD-10-CM | POA: Diagnosis not present

## 2011-09-24 DIAGNOSIS — M6281 Muscle weakness (generalized): Secondary | ICD-10-CM | POA: Diagnosis not present

## 2011-09-24 DIAGNOSIS — R269 Unspecified abnormalities of gait and mobility: Secondary | ICD-10-CM | POA: Diagnosis not present

## 2011-09-24 DIAGNOSIS — R279 Unspecified lack of coordination: Secondary | ICD-10-CM | POA: Diagnosis not present

## 2011-09-27 DIAGNOSIS — Z9181 History of falling: Secondary | ICD-10-CM | POA: Diagnosis not present

## 2011-09-27 DIAGNOSIS — M6281 Muscle weakness (generalized): Secondary | ICD-10-CM | POA: Diagnosis not present

## 2011-09-27 DIAGNOSIS — R279 Unspecified lack of coordination: Secondary | ICD-10-CM | POA: Diagnosis not present

## 2011-09-27 DIAGNOSIS — R269 Unspecified abnormalities of gait and mobility: Secondary | ICD-10-CM | POA: Diagnosis not present

## 2011-09-28 DIAGNOSIS — Z9181 History of falling: Secondary | ICD-10-CM | POA: Diagnosis not present

## 2011-09-28 DIAGNOSIS — R269 Unspecified abnormalities of gait and mobility: Secondary | ICD-10-CM | POA: Diagnosis not present

## 2011-09-28 DIAGNOSIS — M6281 Muscle weakness (generalized): Secondary | ICD-10-CM | POA: Diagnosis not present

## 2011-09-28 DIAGNOSIS — R279 Unspecified lack of coordination: Secondary | ICD-10-CM | POA: Diagnosis not present

## 2011-09-30 DIAGNOSIS — R279 Unspecified lack of coordination: Secondary | ICD-10-CM | POA: Diagnosis not present

## 2011-09-30 DIAGNOSIS — Z9181 History of falling: Secondary | ICD-10-CM | POA: Diagnosis not present

## 2011-09-30 DIAGNOSIS — R269 Unspecified abnormalities of gait and mobility: Secondary | ICD-10-CM | POA: Diagnosis not present

## 2011-09-30 DIAGNOSIS — M6281 Muscle weakness (generalized): Secondary | ICD-10-CM | POA: Diagnosis not present

## 2011-10-05 DIAGNOSIS — Z9181 History of falling: Secondary | ICD-10-CM | POA: Diagnosis not present

## 2011-10-05 DIAGNOSIS — M6281 Muscle weakness (generalized): Secondary | ICD-10-CM | POA: Diagnosis not present

## 2011-10-05 DIAGNOSIS — R269 Unspecified abnormalities of gait and mobility: Secondary | ICD-10-CM | POA: Diagnosis not present

## 2011-10-05 DIAGNOSIS — R279 Unspecified lack of coordination: Secondary | ICD-10-CM | POA: Diagnosis not present

## 2011-10-06 DIAGNOSIS — Z9181 History of falling: Secondary | ICD-10-CM | POA: Diagnosis not present

## 2011-10-06 DIAGNOSIS — R269 Unspecified abnormalities of gait and mobility: Secondary | ICD-10-CM | POA: Diagnosis not present

## 2011-10-06 DIAGNOSIS — R279 Unspecified lack of coordination: Secondary | ICD-10-CM | POA: Diagnosis not present

## 2011-10-06 DIAGNOSIS — M6281 Muscle weakness (generalized): Secondary | ICD-10-CM | POA: Diagnosis not present

## 2011-10-07 DIAGNOSIS — R279 Unspecified lack of coordination: Secondary | ICD-10-CM | POA: Diagnosis not present

## 2011-10-07 DIAGNOSIS — Z9181 History of falling: Secondary | ICD-10-CM | POA: Diagnosis not present

## 2011-10-07 DIAGNOSIS — M6281 Muscle weakness (generalized): Secondary | ICD-10-CM | POA: Diagnosis not present

## 2011-10-07 DIAGNOSIS — R269 Unspecified abnormalities of gait and mobility: Secondary | ICD-10-CM | POA: Diagnosis not present

## 2011-10-11 DIAGNOSIS — M6281 Muscle weakness (generalized): Secondary | ICD-10-CM | POA: Diagnosis not present

## 2011-10-11 DIAGNOSIS — R269 Unspecified abnormalities of gait and mobility: Secondary | ICD-10-CM | POA: Diagnosis not present

## 2011-10-11 DIAGNOSIS — Z9181 History of falling: Secondary | ICD-10-CM | POA: Diagnosis not present

## 2011-10-11 DIAGNOSIS — R279 Unspecified lack of coordination: Secondary | ICD-10-CM | POA: Diagnosis not present

## 2011-10-13 DIAGNOSIS — Z9181 History of falling: Secondary | ICD-10-CM | POA: Diagnosis not present

## 2011-10-13 DIAGNOSIS — M6281 Muscle weakness (generalized): Secondary | ICD-10-CM | POA: Diagnosis not present

## 2011-10-13 DIAGNOSIS — R269 Unspecified abnormalities of gait and mobility: Secondary | ICD-10-CM | POA: Diagnosis not present

## 2011-10-13 DIAGNOSIS — R279 Unspecified lack of coordination: Secondary | ICD-10-CM | POA: Diagnosis not present

## 2011-10-14 DIAGNOSIS — Z9181 History of falling: Secondary | ICD-10-CM | POA: Diagnosis not present

## 2011-10-14 DIAGNOSIS — M6281 Muscle weakness (generalized): Secondary | ICD-10-CM | POA: Diagnosis not present

## 2011-10-14 DIAGNOSIS — R269 Unspecified abnormalities of gait and mobility: Secondary | ICD-10-CM | POA: Diagnosis not present

## 2011-10-14 DIAGNOSIS — R279 Unspecified lack of coordination: Secondary | ICD-10-CM | POA: Diagnosis not present

## 2011-10-15 DIAGNOSIS — Z9181 History of falling: Secondary | ICD-10-CM | POA: Diagnosis not present

## 2011-10-15 DIAGNOSIS — M6281 Muscle weakness (generalized): Secondary | ICD-10-CM | POA: Diagnosis not present

## 2011-10-15 DIAGNOSIS — R269 Unspecified abnormalities of gait and mobility: Secondary | ICD-10-CM | POA: Diagnosis not present

## 2011-10-15 DIAGNOSIS — R279 Unspecified lack of coordination: Secondary | ICD-10-CM | POA: Diagnosis not present

## 2011-10-20 DIAGNOSIS — Z9181 History of falling: Secondary | ICD-10-CM | POA: Diagnosis not present

## 2011-10-20 DIAGNOSIS — M6281 Muscle weakness (generalized): Secondary | ICD-10-CM | POA: Diagnosis not present

## 2011-10-20 DIAGNOSIS — R279 Unspecified lack of coordination: Secondary | ICD-10-CM | POA: Diagnosis not present

## 2011-10-20 DIAGNOSIS — R269 Unspecified abnormalities of gait and mobility: Secondary | ICD-10-CM | POA: Diagnosis not present

## 2011-10-21 DIAGNOSIS — Z9181 History of falling: Secondary | ICD-10-CM | POA: Diagnosis not present

## 2011-10-21 DIAGNOSIS — M6281 Muscle weakness (generalized): Secondary | ICD-10-CM | POA: Diagnosis not present

## 2011-10-21 DIAGNOSIS — R279 Unspecified lack of coordination: Secondary | ICD-10-CM | POA: Diagnosis not present

## 2011-10-21 DIAGNOSIS — R269 Unspecified abnormalities of gait and mobility: Secondary | ICD-10-CM | POA: Diagnosis not present

## 2011-10-22 DIAGNOSIS — R279 Unspecified lack of coordination: Secondary | ICD-10-CM | POA: Diagnosis not present

## 2011-10-22 DIAGNOSIS — R269 Unspecified abnormalities of gait and mobility: Secondary | ICD-10-CM | POA: Diagnosis not present

## 2011-10-22 DIAGNOSIS — Z9181 History of falling: Secondary | ICD-10-CM | POA: Diagnosis not present

## 2011-10-22 DIAGNOSIS — M6281 Muscle weakness (generalized): Secondary | ICD-10-CM | POA: Diagnosis not present

## 2011-10-25 DIAGNOSIS — R279 Unspecified lack of coordination: Secondary | ICD-10-CM | POA: Diagnosis not present

## 2011-10-25 DIAGNOSIS — M6281 Muscle weakness (generalized): Secondary | ICD-10-CM | POA: Diagnosis not present

## 2011-10-25 DIAGNOSIS — Z9181 History of falling: Secondary | ICD-10-CM | POA: Diagnosis not present

## 2011-10-25 DIAGNOSIS — R269 Unspecified abnormalities of gait and mobility: Secondary | ICD-10-CM | POA: Diagnosis not present

## 2011-11-01 DIAGNOSIS — R279 Unspecified lack of coordination: Secondary | ICD-10-CM | POA: Diagnosis not present

## 2011-11-01 DIAGNOSIS — R269 Unspecified abnormalities of gait and mobility: Secondary | ICD-10-CM | POA: Diagnosis not present

## 2011-11-01 DIAGNOSIS — Z9181 History of falling: Secondary | ICD-10-CM | POA: Diagnosis not present

## 2011-11-01 DIAGNOSIS — M6281 Muscle weakness (generalized): Secondary | ICD-10-CM | POA: Diagnosis not present

## 2011-11-10 DIAGNOSIS — K5900 Constipation, unspecified: Secondary | ICD-10-CM

## 2011-11-10 DIAGNOSIS — M4800 Spinal stenosis, site unspecified: Secondary | ICD-10-CM | POA: Diagnosis not present

## 2011-11-10 DIAGNOSIS — F3289 Other specified depressive episodes: Secondary | ICD-10-CM

## 2011-11-10 DIAGNOSIS — R32 Unspecified urinary incontinence: Secondary | ICD-10-CM

## 2011-11-10 DIAGNOSIS — M1990 Unspecified osteoarthritis, unspecified site: Secondary | ICD-10-CM

## 2011-11-10 DIAGNOSIS — F329 Major depressive disorder, single episode, unspecified: Secondary | ICD-10-CM | POA: Diagnosis not present

## 2011-11-10 DIAGNOSIS — M549 Dorsalgia, unspecified: Secondary | ICD-10-CM

## 2011-11-10 DIAGNOSIS — K2100 Gastro-esophageal reflux disease with esophagitis, without bleeding: Secondary | ICD-10-CM

## 2011-11-10 DIAGNOSIS — E039 Hypothyroidism, unspecified: Secondary | ICD-10-CM

## 2011-11-10 DIAGNOSIS — F411 Generalized anxiety disorder: Secondary | ICD-10-CM

## 2011-11-10 DIAGNOSIS — I10 Essential (primary) hypertension: Secondary | ICD-10-CM | POA: Diagnosis not present

## 2011-11-10 HISTORY — DX: Constipation, unspecified: K59.00

## 2011-11-10 HISTORY — DX: Hypothyroidism, unspecified: E03.9

## 2011-11-10 HISTORY — DX: Unspecified urinary incontinence: R32

## 2011-11-10 HISTORY — DX: Major depressive disorder, single episode, unspecified: F32.9

## 2011-11-10 HISTORY — DX: Unspecified osteoarthritis, unspecified site: M19.90

## 2011-11-10 HISTORY — DX: Generalized anxiety disorder: F41.1

## 2011-11-10 HISTORY — DX: Other specified depressive episodes: F32.89

## 2011-11-10 HISTORY — DX: Dorsalgia, unspecified: M54.9

## 2011-11-10 HISTORY — DX: Gastro-esophageal reflux disease with esophagitis, without bleeding: K21.00

## 2011-11-11 DIAGNOSIS — E039 Hypothyroidism, unspecified: Secondary | ICD-10-CM | POA: Diagnosis not present

## 2011-11-11 DIAGNOSIS — F329 Major depressive disorder, single episode, unspecified: Secondary | ICD-10-CM | POA: Diagnosis not present

## 2011-11-11 DIAGNOSIS — Z79899 Other long term (current) drug therapy: Secondary | ICD-10-CM | POA: Diagnosis not present

## 2011-11-11 DIAGNOSIS — I10 Essential (primary) hypertension: Secondary | ICD-10-CM | POA: Diagnosis not present

## 2011-11-22 DIAGNOSIS — F331 Major depressive disorder, recurrent, moderate: Secondary | ICD-10-CM | POA: Diagnosis not present

## 2011-11-29 DIAGNOSIS — B351 Tinea unguium: Secondary | ICD-10-CM | POA: Diagnosis not present

## 2011-11-29 DIAGNOSIS — M79609 Pain in unspecified limb: Secondary | ICD-10-CM | POA: Diagnosis not present

## 2011-11-30 DIAGNOSIS — F331 Major depressive disorder, recurrent, moderate: Secondary | ICD-10-CM | POA: Diagnosis not present

## 2011-12-06 DIAGNOSIS — F331 Major depressive disorder, recurrent, moderate: Secondary | ICD-10-CM | POA: Diagnosis not present

## 2011-12-13 DIAGNOSIS — I10 Essential (primary) hypertension: Secondary | ICD-10-CM | POA: Diagnosis not present

## 2011-12-13 DIAGNOSIS — M549 Dorsalgia, unspecified: Secondary | ICD-10-CM | POA: Diagnosis not present

## 2011-12-13 DIAGNOSIS — Z0000 Encounter for general adult medical examination without abnormal findings: Secondary | ICD-10-CM | POA: Diagnosis not present

## 2011-12-13 DIAGNOSIS — E039 Hypothyroidism, unspecified: Secondary | ICD-10-CM | POA: Diagnosis not present

## 2011-12-13 DIAGNOSIS — Z23 Encounter for immunization: Secondary | ICD-10-CM | POA: Diagnosis not present

## 2011-12-13 DIAGNOSIS — Z8719 Personal history of other diseases of the digestive system: Secondary | ICD-10-CM | POA: Diagnosis not present

## 2011-12-14 DIAGNOSIS — F331 Major depressive disorder, recurrent, moderate: Secondary | ICD-10-CM | POA: Diagnosis not present

## 2011-12-20 DIAGNOSIS — F331 Major depressive disorder, recurrent, moderate: Secondary | ICD-10-CM | POA: Diagnosis not present

## 2011-12-21 DIAGNOSIS — Z23 Encounter for immunization: Secondary | ICD-10-CM | POA: Diagnosis not present

## 2011-12-23 DIAGNOSIS — L538 Other specified erythematous conditions: Secondary | ICD-10-CM | POA: Diagnosis not present

## 2011-12-27 DIAGNOSIS — M545 Low back pain, unspecified: Secondary | ICD-10-CM

## 2011-12-27 DIAGNOSIS — R269 Unspecified abnormalities of gait and mobility: Secondary | ICD-10-CM | POA: Diagnosis not present

## 2011-12-27 DIAGNOSIS — R21 Rash and other nonspecific skin eruption: Secondary | ICD-10-CM

## 2011-12-27 DIAGNOSIS — F329 Major depressive disorder, single episode, unspecified: Secondary | ICD-10-CM | POA: Diagnosis not present

## 2011-12-27 HISTORY — DX: Low back pain, unspecified: M54.50

## 2011-12-27 HISTORY — DX: Rash and other nonspecific skin eruption: R21

## 2011-12-28 DIAGNOSIS — F331 Major depressive disorder, recurrent, moderate: Secondary | ICD-10-CM | POA: Diagnosis not present

## 2012-01-03 DIAGNOSIS — F331 Major depressive disorder, recurrent, moderate: Secondary | ICD-10-CM | POA: Diagnosis not present

## 2012-01-11 DIAGNOSIS — F331 Major depressive disorder, recurrent, moderate: Secondary | ICD-10-CM | POA: Diagnosis not present

## 2012-01-19 DIAGNOSIS — F331 Major depressive disorder, recurrent, moderate: Secondary | ICD-10-CM | POA: Diagnosis not present

## 2012-01-20 DIAGNOSIS — L538 Other specified erythematous conditions: Secondary | ICD-10-CM | POA: Diagnosis not present

## 2012-01-20 DIAGNOSIS — L259 Unspecified contact dermatitis, unspecified cause: Secondary | ICD-10-CM | POA: Diagnosis not present

## 2012-01-24 DIAGNOSIS — L538 Other specified erythematous conditions: Secondary | ICD-10-CM | POA: Diagnosis not present

## 2012-01-24 DIAGNOSIS — M545 Low back pain: Secondary | ICD-10-CM | POA: Diagnosis not present

## 2012-01-24 DIAGNOSIS — M25519 Pain in unspecified shoulder: Secondary | ICD-10-CM | POA: Diagnosis not present

## 2012-01-24 DIAGNOSIS — I339 Acute and subacute endocarditis, unspecified: Secondary | ICD-10-CM | POA: Diagnosis not present

## 2012-01-24 HISTORY — DX: Other specified erythematous conditions: L53.8

## 2012-01-25 DIAGNOSIS — F331 Major depressive disorder, recurrent, moderate: Secondary | ICD-10-CM | POA: Diagnosis not present

## 2012-02-01 DIAGNOSIS — F331 Major depressive disorder, recurrent, moderate: Secondary | ICD-10-CM | POA: Diagnosis not present

## 2012-02-04 DIAGNOSIS — M4800 Spinal stenosis, site unspecified: Secondary | ICD-10-CM | POA: Diagnosis not present

## 2012-02-09 DIAGNOSIS — R609 Edema, unspecified: Secondary | ICD-10-CM

## 2012-02-09 DIAGNOSIS — M4800 Spinal stenosis, site unspecified: Secondary | ICD-10-CM | POA: Diagnosis not present

## 2012-02-09 HISTORY — DX: Edema, unspecified: R60.9

## 2012-02-15 DIAGNOSIS — F331 Major depressive disorder, recurrent, moderate: Secondary | ICD-10-CM | POA: Diagnosis not present

## 2012-02-21 DIAGNOSIS — B351 Tinea unguium: Secondary | ICD-10-CM | POA: Diagnosis not present

## 2012-02-21 DIAGNOSIS — M79609 Pain in unspecified limb: Secondary | ICD-10-CM | POA: Diagnosis not present

## 2012-02-22 DIAGNOSIS — F331 Major depressive disorder, recurrent, moderate: Secondary | ICD-10-CM | POA: Diagnosis not present

## 2012-02-28 DIAGNOSIS — I10 Essential (primary) hypertension: Secondary | ICD-10-CM | POA: Diagnosis not present

## 2012-02-28 DIAGNOSIS — M25519 Pain in unspecified shoulder: Secondary | ICD-10-CM | POA: Diagnosis not present

## 2012-02-28 DIAGNOSIS — M4800 Spinal stenosis, site unspecified: Secondary | ICD-10-CM | POA: Diagnosis not present

## 2012-03-01 DIAGNOSIS — F331 Major depressive disorder, recurrent, moderate: Secondary | ICD-10-CM | POA: Diagnosis not present

## 2012-03-13 DIAGNOSIS — F331 Major depressive disorder, recurrent, moderate: Secondary | ICD-10-CM | POA: Diagnosis not present

## 2012-03-21 DIAGNOSIS — F331 Major depressive disorder, recurrent, moderate: Secondary | ICD-10-CM | POA: Diagnosis not present

## 2012-03-28 DIAGNOSIS — F331 Major depressive disorder, recurrent, moderate: Secondary | ICD-10-CM | POA: Diagnosis not present

## 2012-04-04 DIAGNOSIS — F331 Major depressive disorder, recurrent, moderate: Secondary | ICD-10-CM | POA: Diagnosis not present

## 2012-04-11 DIAGNOSIS — F331 Major depressive disorder, recurrent, moderate: Secondary | ICD-10-CM | POA: Diagnosis not present

## 2012-04-12 DIAGNOSIS — K5900 Constipation, unspecified: Secondary | ICD-10-CM | POA: Diagnosis not present

## 2012-04-12 DIAGNOSIS — M4800 Spinal stenosis, site unspecified: Secondary | ICD-10-CM | POA: Diagnosis not present

## 2012-04-12 DIAGNOSIS — I10 Essential (primary) hypertension: Secondary | ICD-10-CM | POA: Diagnosis not present

## 2012-04-18 DIAGNOSIS — F331 Major depressive disorder, recurrent, moderate: Secondary | ICD-10-CM | POA: Diagnosis not present

## 2012-04-28 DIAGNOSIS — J069 Acute upper respiratory infection, unspecified: Secondary | ICD-10-CM

## 2012-04-28 HISTORY — DX: Acute upper respiratory infection, unspecified: J06.9

## 2012-05-09 DIAGNOSIS — F331 Major depressive disorder, recurrent, moderate: Secondary | ICD-10-CM | POA: Diagnosis not present

## 2012-05-16 DIAGNOSIS — F331 Major depressive disorder, recurrent, moderate: Secondary | ICD-10-CM | POA: Diagnosis not present

## 2012-05-17 DIAGNOSIS — B351 Tinea unguium: Secondary | ICD-10-CM | POA: Diagnosis not present

## 2012-05-17 DIAGNOSIS — M79609 Pain in unspecified limb: Secondary | ICD-10-CM | POA: Diagnosis not present

## 2012-05-23 DIAGNOSIS — I10 Essential (primary) hypertension: Secondary | ICD-10-CM | POA: Diagnosis not present

## 2012-05-23 DIAGNOSIS — E785 Hyperlipidemia, unspecified: Secondary | ICD-10-CM | POA: Diagnosis not present

## 2012-05-23 DIAGNOSIS — F331 Major depressive disorder, recurrent, moderate: Secondary | ICD-10-CM | POA: Diagnosis not present

## 2012-05-24 DIAGNOSIS — H43399 Other vitreous opacities, unspecified eye: Secondary | ICD-10-CM | POA: Diagnosis not present

## 2012-05-24 DIAGNOSIS — H26499 Other secondary cataract, unspecified eye: Secondary | ICD-10-CM | POA: Diagnosis not present

## 2012-05-29 DIAGNOSIS — I10 Essential (primary) hypertension: Secondary | ICD-10-CM | POA: Diagnosis not present

## 2012-05-29 DIAGNOSIS — M25519 Pain in unspecified shoulder: Secondary | ICD-10-CM | POA: Diagnosis not present

## 2012-05-29 DIAGNOSIS — M4800 Spinal stenosis, site unspecified: Secondary | ICD-10-CM | POA: Diagnosis not present

## 2012-05-29 DIAGNOSIS — E039 Hypothyroidism, unspecified: Secondary | ICD-10-CM | POA: Diagnosis not present

## 2012-05-30 DIAGNOSIS — F331 Major depressive disorder, recurrent, moderate: Secondary | ICD-10-CM | POA: Diagnosis not present

## 2012-06-06 DIAGNOSIS — F331 Major depressive disorder, recurrent, moderate: Secondary | ICD-10-CM | POA: Diagnosis not present

## 2012-06-13 ENCOUNTER — Other Ambulatory Visit: Payer: Self-pay | Admitting: Geriatric Medicine

## 2012-06-13 DIAGNOSIS — F331 Major depressive disorder, recurrent, moderate: Secondary | ICD-10-CM | POA: Diagnosis not present

## 2012-06-20 DIAGNOSIS — F331 Major depressive disorder, recurrent, moderate: Secondary | ICD-10-CM | POA: Diagnosis not present

## 2012-06-27 DIAGNOSIS — F331 Major depressive disorder, recurrent, moderate: Secondary | ICD-10-CM | POA: Diagnosis not present

## 2012-07-04 DIAGNOSIS — F331 Major depressive disorder, recurrent, moderate: Secondary | ICD-10-CM | POA: Diagnosis not present

## 2012-07-04 IMAGING — CT CT HEAD WO/W CM
1 of 2 series · 13 of 30 positions shown, 17 images · IV contrast (75CC OMNI 300)
Comparison: None.

CLINICAL DATA: Gait abnormality in mental status change; history of
pituitary adenoma 30 years ago

CT HEAD WITHOUT AND WITH CONTRAST
TECHNIQUE: Contiguous axial images were obtained from the base of
the skull through the vertex without and with intravenous contrast.
Contrast: 75mL OMNIPAQUE IOHEXOL 300 MG/ML IV SOLN

[Series 32: 3d filtered head w/o · axial · non-contrast · 0.49mm/px · z∈[+34,+160]mm · 13 of 28 slices shown, 17 images]
[im 2/28  brain]
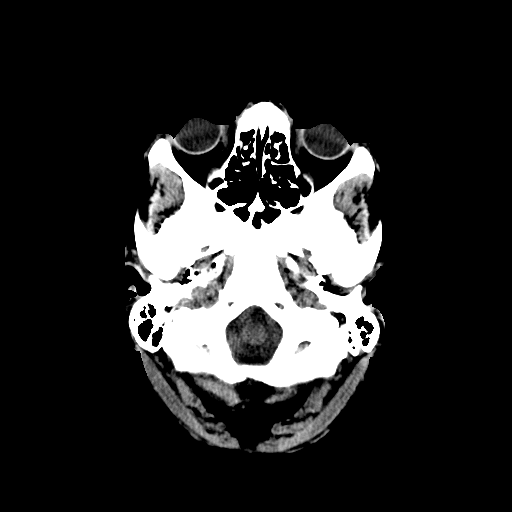
[im 2/28  bone]
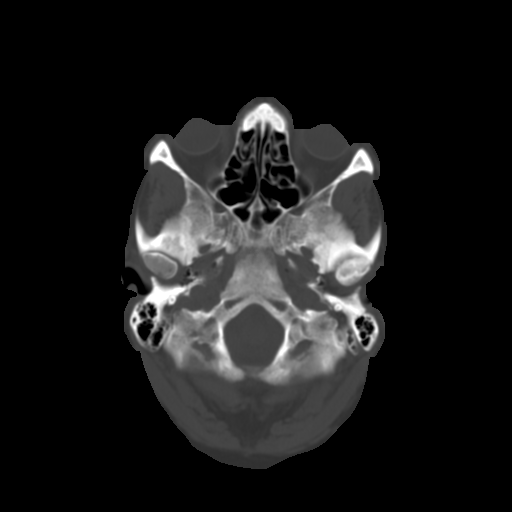
[im 4/28  brain]
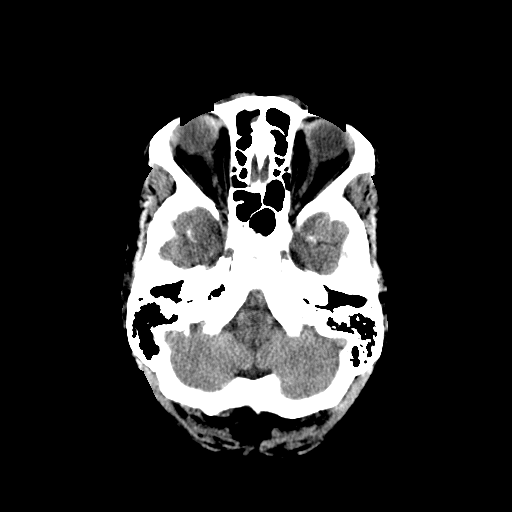
[im 6/28  brain]
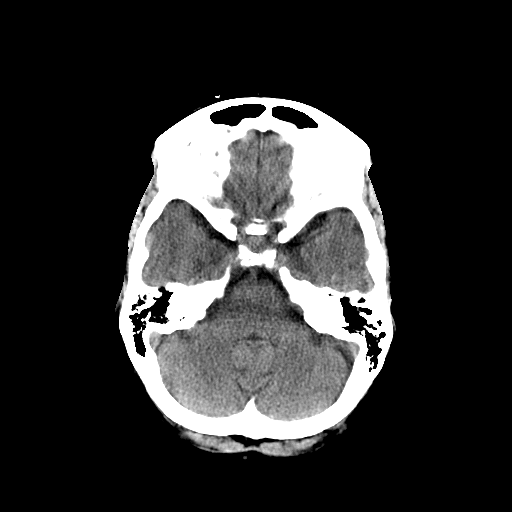
[im 8/28  brain]
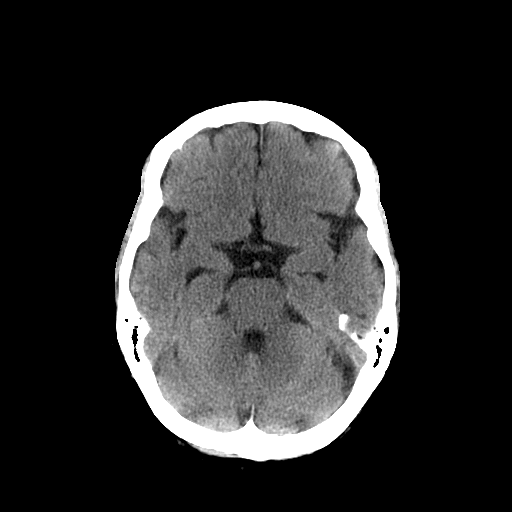
[im 10/28  brain]
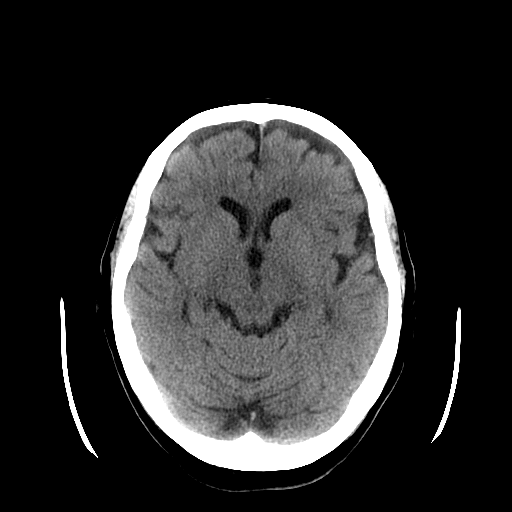
[im 10/28  bone]
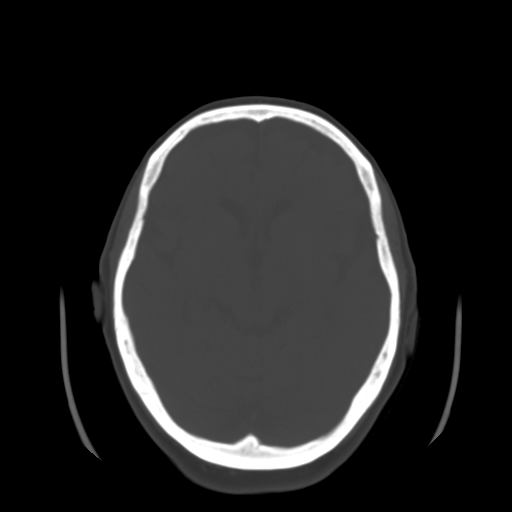
[im 12/28  brain]
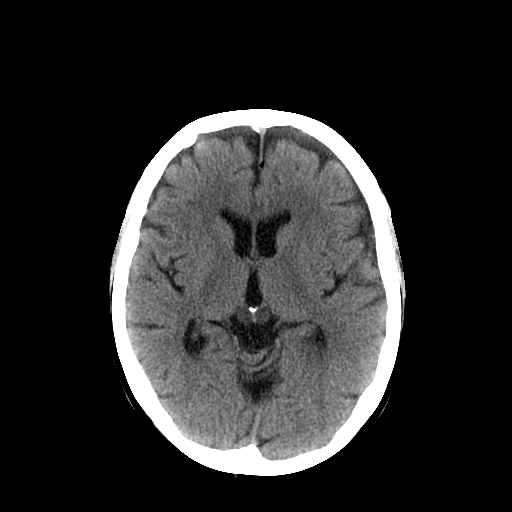
[im 14/28  brain]
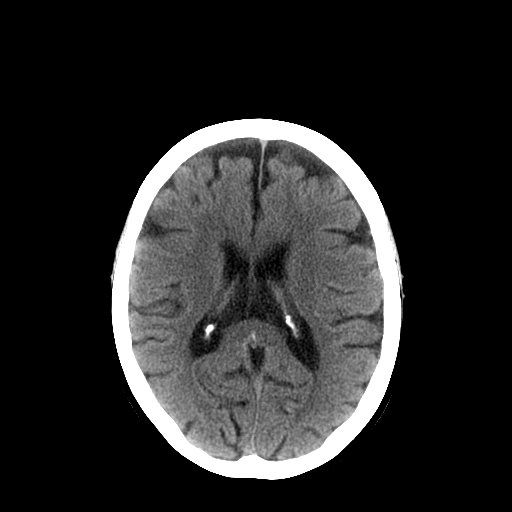
[im 16/28  brain]
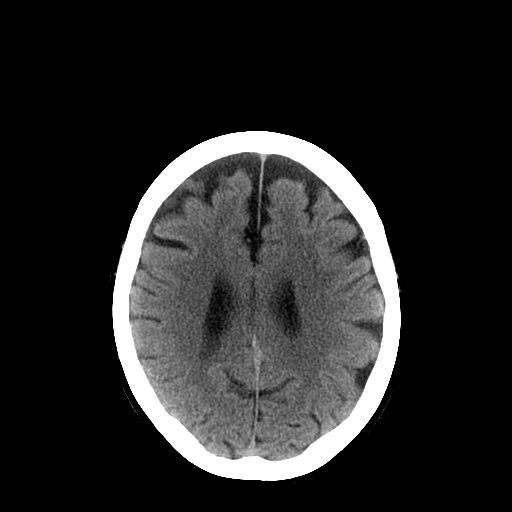
[im 18/28  brain]
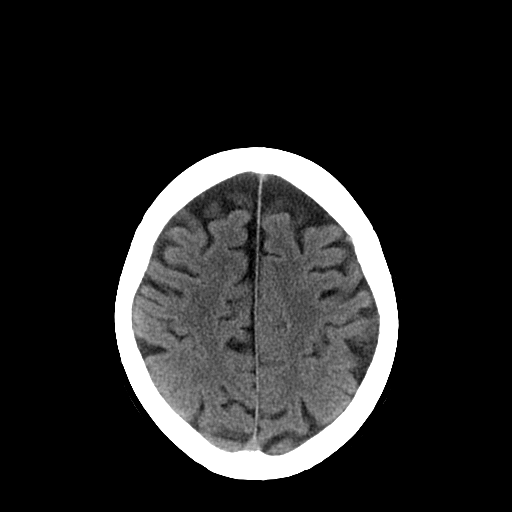
[im 18/28  bone]
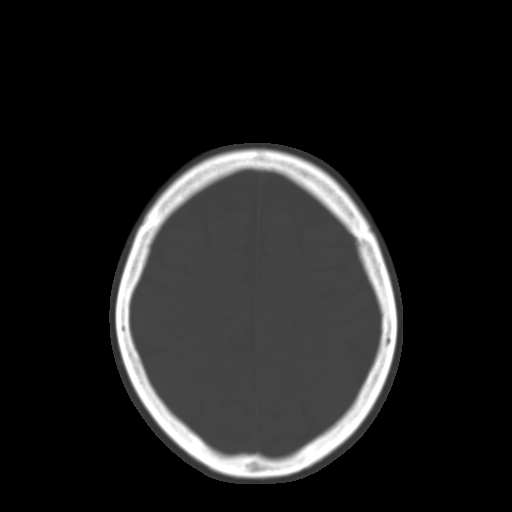
[im 20/28  brain]
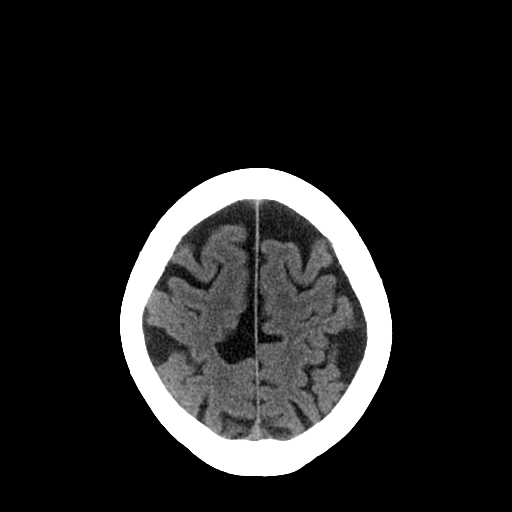
[im 22/28  brain]
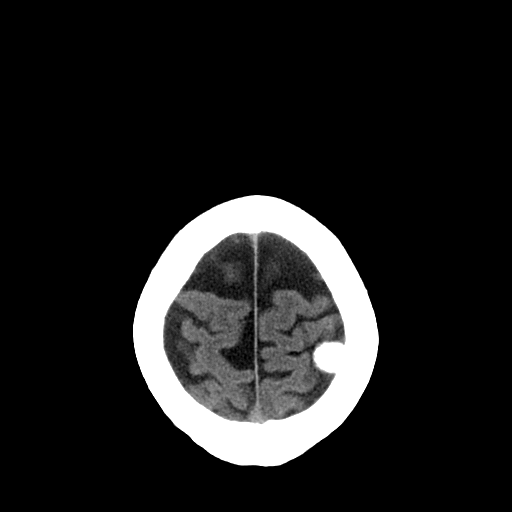
[im 24/28  brain]
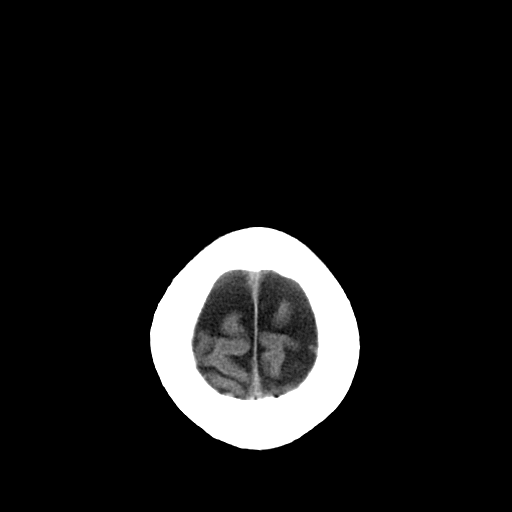
[im 26/28  brain]
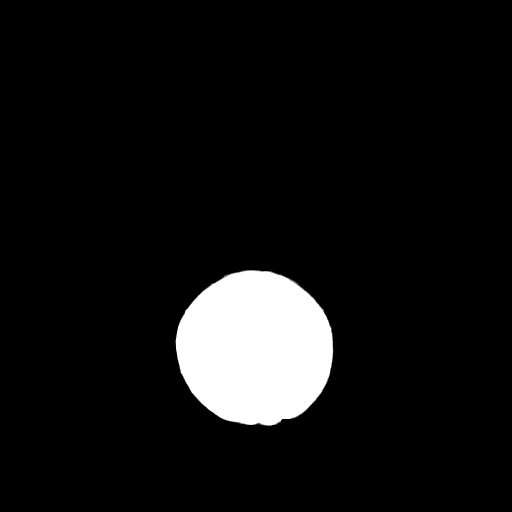
[im 26/28  bone]
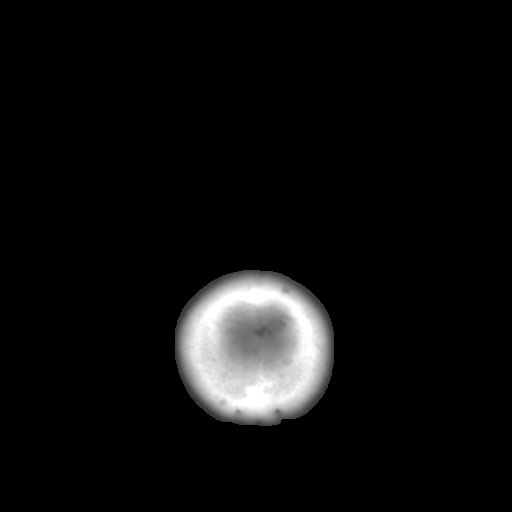

[13 of 30 positions shown; findings below may reference images not displayed]

FINDINGS: There is a 1.5 cm densely calcified extra-axial mass at
the left frontoparietal convexity, consistent with a meningioma.
There is a 6 mm similar appearing mass in the right frontal region.
There is no adjacent mass effect or edema.  The ventricles are
normal in size, shape, and position.  There is no midline shift.
No abnormal extra-axial fluid collections are identified.  There is
no acute hemorrhage.  The gray/white differentiation is normal.
There is no abnormal contrast enhancement.  The orbits, calvarium,
visualized paranasal sinuses have a normal appearance.
IMPRESSION: 1.5 cm left frontal parietal meningioma and 6 mm right frontal
meningioma.  Neither exhibit significant mass effect or adjacent
edema.

## 2012-07-11 DIAGNOSIS — F331 Major depressive disorder, recurrent, moderate: Secondary | ICD-10-CM | POA: Diagnosis not present

## 2012-07-18 DIAGNOSIS — F331 Major depressive disorder, recurrent, moderate: Secondary | ICD-10-CM | POA: Diagnosis not present

## 2012-07-19 DIAGNOSIS — L538 Other specified erythematous conditions: Secondary | ICD-10-CM | POA: Diagnosis not present

## 2012-07-19 DIAGNOSIS — I872 Venous insufficiency (chronic) (peripheral): Secondary | ICD-10-CM | POA: Diagnosis not present

## 2012-07-19 DIAGNOSIS — D239 Other benign neoplasm of skin, unspecified: Secondary | ICD-10-CM | POA: Diagnosis not present

## 2012-07-19 DIAGNOSIS — L219 Seborrheic dermatitis, unspecified: Secondary | ICD-10-CM | POA: Diagnosis not present

## 2012-07-19 DIAGNOSIS — L819 Disorder of pigmentation, unspecified: Secondary | ICD-10-CM | POA: Diagnosis not present

## 2012-07-19 DIAGNOSIS — L821 Other seborrheic keratosis: Secondary | ICD-10-CM | POA: Diagnosis not present

## 2012-07-19 DIAGNOSIS — D1801 Hemangioma of skin and subcutaneous tissue: Secondary | ICD-10-CM | POA: Diagnosis not present

## 2012-07-25 DIAGNOSIS — F331 Major depressive disorder, recurrent, moderate: Secondary | ICD-10-CM | POA: Diagnosis not present

## 2012-07-28 ENCOUNTER — Other Ambulatory Visit: Payer: Self-pay | Admitting: *Deleted

## 2012-07-28 MED ORDER — ESTRADIOL 0.5 MG PO TABS: ORAL_TABLET | ORAL | Status: DC

## 2012-08-01 DIAGNOSIS — F331 Major depressive disorder, recurrent, moderate: Secondary | ICD-10-CM | POA: Diagnosis not present

## 2012-08-08 DIAGNOSIS — F331 Major depressive disorder, recurrent, moderate: Secondary | ICD-10-CM | POA: Diagnosis not present

## 2012-08-14 ENCOUNTER — Other Ambulatory Visit: Payer: Self-pay | Admitting: Geriatric Medicine

## 2012-08-14 DIAGNOSIS — M79609 Pain in unspecified limb: Secondary | ICD-10-CM | POA: Diagnosis not present

## 2012-08-14 DIAGNOSIS — B351 Tinea unguium: Secondary | ICD-10-CM | POA: Diagnosis not present

## 2012-08-15 DIAGNOSIS — F331 Major depressive disorder, recurrent, moderate: Secondary | ICD-10-CM | POA: Diagnosis not present

## 2012-08-22 DIAGNOSIS — F331 Major depressive disorder, recurrent, moderate: Secondary | ICD-10-CM | POA: Diagnosis not present

## 2012-08-29 DIAGNOSIS — F331 Major depressive disorder, recurrent, moderate: Secondary | ICD-10-CM | POA: Diagnosis not present

## 2012-09-04 ENCOUNTER — Other Ambulatory Visit: Payer: Self-pay | Admitting: *Deleted

## 2012-09-04 MED ORDER — SITAGLIPTIN PHOSPHATE 50 MG PO TABS: ORAL_TABLET | ORAL | Status: DC

## 2012-09-04 MED ORDER — METOPROLOL SUCCINATE ER 25 MG PO TB24: ORAL_TABLET | ORAL | Status: DC

## 2012-09-04 MED ORDER — OMEPRAZOLE 20 MG PO CPDR: DELAYED_RELEASE_CAPSULE | ORAL | Status: DC

## 2012-09-05 DIAGNOSIS — Z124 Encounter for screening for malignant neoplasm of cervix: Secondary | ICD-10-CM | POA: Diagnosis not present

## 2012-09-05 DIAGNOSIS — Z13820 Encounter for screening for osteoporosis: Secondary | ICD-10-CM | POA: Diagnosis not present

## 2012-09-05 DIAGNOSIS — Z1231 Encounter for screening mammogram for malignant neoplasm of breast: Secondary | ICD-10-CM | POA: Diagnosis not present

## 2012-09-05 DIAGNOSIS — M810 Age-related osteoporosis without current pathological fracture: Secondary | ICD-10-CM | POA: Diagnosis not present

## 2012-09-13 ENCOUNTER — Encounter: Payer: Self-pay | Admitting: Geriatric Medicine

## 2012-09-13 ENCOUNTER — Other Ambulatory Visit: Payer: Self-pay | Admitting: *Deleted

## 2012-09-13 MED ORDER — LEVOTHYROXINE SODIUM 100 MCG PO TABS: ORAL_TABLET | ORAL | Status: DC

## 2012-09-19 DIAGNOSIS — F331 Major depressive disorder, recurrent, moderate: Secondary | ICD-10-CM | POA: Diagnosis not present

## 2012-09-20 ENCOUNTER — Non-Acute Institutional Stay: Payer: Medicare Other | Admitting: Geriatric Medicine

## 2012-09-20 ENCOUNTER — Encounter: Payer: Self-pay | Admitting: Geriatric Medicine

## 2012-09-20 VITALS — BP 130/68 | HR 68 | Ht <= 58 in | Wt 147.0 lb

## 2012-09-20 DIAGNOSIS — M4800 Spinal stenosis, site unspecified: Secondary | ICD-10-CM | POA: Diagnosis not present

## 2012-09-20 DIAGNOSIS — E785 Hyperlipidemia, unspecified: Secondary | ICD-10-CM

## 2012-09-20 DIAGNOSIS — I10 Essential (primary) hypertension: Secondary | ICD-10-CM | POA: Insufficient documentation

## 2012-09-20 DIAGNOSIS — E039 Hypothyroidism, unspecified: Secondary | ICD-10-CM

## 2012-09-20 DIAGNOSIS — E7849 Other hyperlipidemia: Secondary | ICD-10-CM | POA: Insufficient documentation

## 2012-09-20 DIAGNOSIS — F411 Generalized anxiety disorder: Secondary | ICD-10-CM

## 2012-09-20 NOTE — Progress Notes (Signed)
Patient ID: Kelly Irwin, female   DOB: [birth year], 79 y.o.   MRN: [id number] [facility] [phone])  Chief Complaint  Patient presents with  . Medical Managment of Chronic Issues    blood pressure, thyroid, depression    HPI: This is a 79 y.o. female resident of [facility],  Assisted Living section evaluated today for management of ongoing medical issues.  Review of record shows patient's blood pressure has been well-controlled. Patient continues to follow with Dr. Nolen Mu for management of depression and anxiety. Her medications have been adjusted recently, patient reports she's feeling better, though does report she continues to worry about her spouse's health.  Her back pain is well-controlled with current medications. Patient is able to attend exercise class 3x/times a week.     Allergies  Allergen Reactions  . Cozaar (Losartan Potassium)   . Doxycycline   . Lamictal (Lamotrigine)   . Neurontin (Gabapentin)   . Nsaids    Medications Current outpatient prescriptions:acetaminophen (TYLENOL) 325 MG tablet, Take 650 mg by mouth. Take 2 tablets every 6 hours as needed for pain, Disp: , Rfl: ;  ammonium lactate (LAC-HYDRIN) 12 % lotion, Apply topically. Apply to lower leg as needed for rash, Disp: , Rfl: ;  ARIPiprazole (ABILIFY) 2 MG tablet, Take 2 mg by mouth. Take 1/2 tablet at bedtime for depression, Disp: , Rfl:  busPIRone (BUSPAR) 5 MG tablet, Take 5 mg by mouth. Take one tablet in morning, may have extra 5mg  as needed during the day, Disp: , Rfl: ;  Calcium Carbonate-Vitamin D (CALCIUM 600+D3) 600-400 MG-UNIT per tablet, Take 1 tablet by mouth daily. Take one tablet twice daily, Disp: , Rfl: ;  estradiol (ESTRACE) 0.5 MG tablet, Take one tablet Monday and Thursday for hormone supplement, Disp: 8 tablet, Rfl: 6 levothyroxine (SYNTHROID, LEVOTHROID) 100 MCG tablet, Take one tablet once a day for tyroid supplement, Disp: 30 tablet, Rfl: 5;   LORazepam (ATIVAN) 0.5 MG tablet, Take 0.5 mg by mouth. Take one tablet at bedtime, Disp: , Rfl: ;  Melatonin 3 MG CAPS, Take by mouth. Take one capsule daily, Disp: , Rfl:  methadone (DOLOPHINE) 5 MG tablet, Take 5 mg by mouth. Take 2 tablets at 9am and 1 1/2 tablet at 1pm and 2 tablets at 9pm to treat chronic pain, Disp: , Rfl: ;  metoprolol succinate (TOPROL-XL) 25 MG 24 hr tablet, Take one tablet every day for blood pressure, Disp: 90 tablet, Rfl: 3;  Multiple Vitamins-Minerals (CARRAVITE PO), Take by mouth. Take one tablet daily, Disp: , Rfl:  nystatin-triamcinolone ointment (MYCOLOG), Apply topically. Apply daily to inflammatory folds under breast once a morning, Disp: , Rfl: ;  omeprazole (PRILOSEC) 20 MG capsule, Take one capsule twice daily to reduce stomach acid, Disp: 60 capsule, Rfl: 3;  polyethylene glycol (MIRALAX / GLYCOLAX) packet, Take 17 g by mouth daily. For constipation. Hold for loose stool, Disp: , Rfl:  sertraline (ZOLOFT) 50 MG tablet, Take 50 mg by mouth daily. For depression, Disp: , Rfl: ;  sitaGLIPtin (JANUVIA) 50 MG tablet, Take one tablet once daily to help diabetic control, Disp: 30 tablet, Rfl: 6;  talc (ZEASORB) powder, Apply topically. Apply twice daily to areas under breast and on feet, Disp: , Rfl:    DATA REVIEWED  Laboratory Studies: [facility], external 11/11/2011 CBC: Wbc 8.4, Rbc 4.83, Hgb 13.0, Hct 38.7 CMP: Sodium 139, Potassium 5.1, glucose 93, BUN 20, Creatinine 0.86 Lipid: cholesterol 246, triglyceride 180, HDL 6.0, LDL 169 TSH 2.823  05/23/2012: Glucose 84, BUN 21, creatinine 0.4, sodium 137, potassium 4.1.  Total cholesterol 233, triglyceride 144, HDL 36, LDL 151     Review of Systems  DATA OBTAINED: from patient, medical record GENERAL: Feels well   No fevers, fatigue, change in appetite or weight SKIN: No itch, rash or open wounds EYES: No eye pain, dryness or itching  No change in vision EARS: No earache, tinnitus, change in hearing NOSE: No  congestion, drainage or bleeding MOUTH/THROAT: No mouth or tooth pain  No sore throat No difficulty chewing or swallowing RESPIRATORY: No cough, wheezing, SOB CARDIAC: No chest pain, palpitations  No edema. GI: No abdominal pain  No N/V/D or constipation  No heartburn or reflux  GU: No dysuria, frequency or urgency  No change in urine volume or character  Nocturia 2-3x/night, able to go back to sleep easily   MUSCULOSKELETAL: No joint pain, swelling or stiffness   Back pain (chronic)  No muscle ache, pain, weakness  Gait is steady  No recent falls.  NEUROLOGIC: No dizziness, fainting, headache  No change in mental status.  PSYCHIATRIC: Some anxiety, depression - see HPI   Sleeps well.  No behavior issue.    Physical Exam Filed Vitals:   09/20/12 1035  BP: 130/68  Pulse: 68  Height: 4\' 9"  (1.448 m)  Weight: 147 lb (66.679 kg)   Body mass index is 31.8 kg/(m^2).  GENERAL APPEARANCE: No acute distress, appropriately groomed, Overweight body habitus. Alert, pleasant, conversant. SKIN: No diaphoresis rash, unusual lesions, wounds HEAD: Normocephalic, atraumatic EYES: Conjunctiva/lids clear. Pupils round, reactive.   EARS: External exam WNL,  Hearing grossly normal. NOSE: No deformity or discharge. MOUTH/THROAT: Lips w/o lesions. Oral mucosa, tongue moist, w/o lesion. Oropharynx w/o redness or lesions.  NECK: Supple, full ROM. No thyroid tenderness, enlargement or nodule LYMPHATICS: No head, neck or supraclavicular adenopathy RESPIRATORY: Breathing is even, unlabored. Lung sounds are clear and full.  CARDIOVASCULAR: Heart RRR. No murmur or extra heart sounds  EDEMA: No peripheral edema.  GASTROINTESTINAL: Abdomen is soft, non-tender, not distended w/ normal bowel sounds MUSCULOSKELETAL: Moves all extremities with full ROM, strength and tone. Back  with mild kyphosis, No scoliosis or spinal process tenderness. Gait is steady/walker NEUROLOGIC: Oriented to time, place, person. Speech  clear, no tremor.  PSYCHIATRIC: Mood and affect appropriate to situation  ASSESSMENT/PLAN  Spinal stenosis, unspecified region other than cervical Chronic back pain remains well controlled with current medications. She is exhibiting no  adverse effects from t.i.d. dosing of methadone. Patient continues to participate in exercise class III times a week and emulates the hallway with her walker daily.  Unspecified essential hypertension Weekly blood pressure readings remain satisfactory, range 109-139/60-70.  Unspecified hypothyroidism No signs of hypothyroidism, patient continues to take supplement daily. Update lab  Reflux esophagitis No recent complaints of reflux symptoms, patient reports that the omeprazole keeps his symptoms well and controlled. Continue current medications. Will check vitamin B12 level  Anxiety state, unspecified Patient continues with mild anxiety especially related to her husband's health. She feels the symptoms are under control with the current medications. She will continue to follow closely with Dr. Nolen Mu.    Follow up: 3 months Lab: 10/2012: CBC, CMP, TSH, B12  Shady Padron T.Aaryav Hopfensperger, NP-C 09/20/2012

## 2012-09-21 ENCOUNTER — Encounter: Payer: Self-pay | Admitting: Geriatric Medicine

## 2012-09-21 NOTE — Assessment & Plan Note (Signed)
No signs of hypothyroidism, patient continues to take supplement daily. Update lab

## 2012-09-21 NOTE — Assessment & Plan Note (Signed)
Chronic back pain remains well controlled with current medications. She is exhibiting no  adverse effects from t.i.d. dosing of methadone. Patient continues to participate in exercise class III times a week and emulates the hallway with her walker daily.

## 2012-09-21 NOTE — Assessment & Plan Note (Signed)
Patient continues with mild anxiety especially related to her husband's health. She feels the symptoms are under control with the current medications. She will continue to follow closely with Dr. Nolen Mu.

## 2012-09-21 NOTE — Assessment & Plan Note (Signed)
Weekly blood pressure readings remain satisfactory, range 109-139/60-70.

## 2012-09-21 NOTE — Assessment & Plan Note (Signed)
No recent complaints of reflux symptoms, patient reports that the omeprazole keeps his symptoms well and controlled. Continue current medications. Will check vitamin B12 level

## 2012-09-27 DIAGNOSIS — F331 Major depressive disorder, recurrent, moderate: Secondary | ICD-10-CM | POA: Diagnosis not present

## 2012-10-02 DIAGNOSIS — M6281 Muscle weakness (generalized): Secondary | ICD-10-CM | POA: Diagnosis not present

## 2012-10-02 DIAGNOSIS — R32 Unspecified urinary incontinence: Secondary | ICD-10-CM | POA: Diagnosis not present

## 2012-10-03 DIAGNOSIS — F331 Major depressive disorder, recurrent, moderate: Secondary | ICD-10-CM | POA: Diagnosis not present

## 2012-10-03 DIAGNOSIS — R32 Unspecified urinary incontinence: Secondary | ICD-10-CM | POA: Diagnosis not present

## 2012-10-03 DIAGNOSIS — M6281 Muscle weakness (generalized): Secondary | ICD-10-CM | POA: Diagnosis not present

## 2012-10-05 DIAGNOSIS — M6281 Muscle weakness (generalized): Secondary | ICD-10-CM | POA: Diagnosis not present

## 2012-10-05 DIAGNOSIS — R32 Unspecified urinary incontinence: Secondary | ICD-10-CM | POA: Diagnosis not present

## 2012-10-06 ENCOUNTER — Other Ambulatory Visit: Payer: Self-pay | Admitting: Geriatric Medicine

## 2012-10-06 MED ORDER — METHADONE HCL 5 MG PO TABS: 5.0000 mg | ORAL_TABLET | ORAL | Status: DC

## 2012-10-10 DIAGNOSIS — F331 Major depressive disorder, recurrent, moderate: Secondary | ICD-10-CM | POA: Diagnosis not present

## 2012-10-17 DIAGNOSIS — I10 Essential (primary) hypertension: Secondary | ICD-10-CM | POA: Diagnosis not present

## 2012-10-17 DIAGNOSIS — Z79899 Other long term (current) drug therapy: Secondary | ICD-10-CM | POA: Diagnosis not present

## 2012-10-17 DIAGNOSIS — E039 Hypothyroidism, unspecified: Secondary | ICD-10-CM | POA: Diagnosis not present

## 2012-10-25 DIAGNOSIS — F331 Major depressive disorder, recurrent, moderate: Secondary | ICD-10-CM | POA: Diagnosis not present

## 2012-10-26 DIAGNOSIS — R269 Unspecified abnormalities of gait and mobility: Secondary | ICD-10-CM | POA: Diagnosis not present

## 2012-10-26 DIAGNOSIS — M48061 Spinal stenosis, lumbar region without neurogenic claudication: Secondary | ICD-10-CM | POA: Diagnosis not present

## 2012-10-26 DIAGNOSIS — R279 Unspecified lack of coordination: Secondary | ICD-10-CM | POA: Diagnosis not present

## 2012-10-30 DIAGNOSIS — R279 Unspecified lack of coordination: Secondary | ICD-10-CM | POA: Diagnosis not present

## 2012-10-30 DIAGNOSIS — M48061 Spinal stenosis, lumbar region without neurogenic claudication: Secondary | ICD-10-CM | POA: Diagnosis not present

## 2012-10-30 DIAGNOSIS — R269 Unspecified abnormalities of gait and mobility: Secondary | ICD-10-CM | POA: Diagnosis not present

## 2012-10-31 DIAGNOSIS — F331 Major depressive disorder, recurrent, moderate: Secondary | ICD-10-CM | POA: Diagnosis not present

## 2012-11-01 DIAGNOSIS — R269 Unspecified abnormalities of gait and mobility: Secondary | ICD-10-CM | POA: Diagnosis not present

## 2012-11-01 DIAGNOSIS — M48061 Spinal stenosis, lumbar region without neurogenic claudication: Secondary | ICD-10-CM | POA: Diagnosis not present

## 2012-11-01 DIAGNOSIS — R279 Unspecified lack of coordination: Secondary | ICD-10-CM | POA: Diagnosis not present

## 2012-11-02 DIAGNOSIS — R279 Unspecified lack of coordination: Secondary | ICD-10-CM | POA: Diagnosis not present

## 2012-11-02 DIAGNOSIS — M48061 Spinal stenosis, lumbar region without neurogenic claudication: Secondary | ICD-10-CM | POA: Diagnosis not present

## 2012-11-02 DIAGNOSIS — R269 Unspecified abnormalities of gait and mobility: Secondary | ICD-10-CM | POA: Diagnosis not present

## 2012-11-06 ENCOUNTER — Other Ambulatory Visit: Payer: Self-pay | Admitting: Geriatric Medicine

## 2012-11-06 MED ORDER — METHADONE HCL 5 MG PO TABS: 5.0000 mg | ORAL_TABLET | ORAL | Status: DC

## 2012-11-07 DIAGNOSIS — B351 Tinea unguium: Secondary | ICD-10-CM | POA: Diagnosis not present

## 2012-11-07 DIAGNOSIS — M79609 Pain in unspecified limb: Secondary | ICD-10-CM | POA: Diagnosis not present

## 2012-11-08 DIAGNOSIS — F331 Major depressive disorder, recurrent, moderate: Secondary | ICD-10-CM | POA: Diagnosis not present

## 2012-11-09 DIAGNOSIS — R269 Unspecified abnormalities of gait and mobility: Secondary | ICD-10-CM | POA: Diagnosis not present

## 2012-11-09 DIAGNOSIS — R279 Unspecified lack of coordination: Secondary | ICD-10-CM | POA: Diagnosis not present

## 2012-11-09 DIAGNOSIS — M48061 Spinal stenosis, lumbar region without neurogenic claudication: Secondary | ICD-10-CM | POA: Diagnosis not present

## 2012-11-10 DIAGNOSIS — M48061 Spinal stenosis, lumbar region without neurogenic claudication: Secondary | ICD-10-CM | POA: Diagnosis not present

## 2012-11-10 DIAGNOSIS — R269 Unspecified abnormalities of gait and mobility: Secondary | ICD-10-CM | POA: Diagnosis not present

## 2012-11-10 DIAGNOSIS — R279 Unspecified lack of coordination: Secondary | ICD-10-CM | POA: Diagnosis not present

## 2012-11-14 DIAGNOSIS — M48061 Spinal stenosis, lumbar region without neurogenic claudication: Secondary | ICD-10-CM | POA: Diagnosis not present

## 2012-11-14 DIAGNOSIS — R269 Unspecified abnormalities of gait and mobility: Secondary | ICD-10-CM | POA: Diagnosis not present

## 2012-11-14 DIAGNOSIS — R279 Unspecified lack of coordination: Secondary | ICD-10-CM | POA: Diagnosis not present

## 2012-11-15 ENCOUNTER — Encounter: Payer: Self-pay | Admitting: Geriatric Medicine

## 2012-11-15 ENCOUNTER — Non-Acute Institutional Stay: Payer: Medicare Other | Admitting: Geriatric Medicine

## 2012-11-15 VITALS — BP 136/80 | HR 59 | Resp 13 | Ht <= 58 in | Wt 148.0 lb

## 2012-11-15 DIAGNOSIS — R269 Unspecified abnormalities of gait and mobility: Secondary | ICD-10-CM

## 2012-11-15 DIAGNOSIS — I10 Essential (primary) hypertension: Secondary | ICD-10-CM

## 2012-11-15 DIAGNOSIS — F09 Unspecified mental disorder due to known physiological condition: Secondary | ICD-10-CM

## 2012-11-15 DIAGNOSIS — E039 Hypothyroidism, unspecified: Secondary | ICD-10-CM

## 2012-11-15 DIAGNOSIS — K2100 Gastro-esophageal reflux disease with esophagitis, without bleeding: Secondary | ICD-10-CM

## 2012-11-15 DIAGNOSIS — R4189 Other symptoms and signs involving cognitive functions and awareness: Secondary | ICD-10-CM | POA: Insufficient documentation

## 2012-11-15 DIAGNOSIS — F411 Generalized anxiety disorder: Secondary | ICD-10-CM

## 2012-11-15 DIAGNOSIS — M4800 Spinal stenosis, site unspecified: Secondary | ICD-10-CM

## 2012-11-15 HISTORY — DX: Unspecified abnormalities of gait and mobility: R26.9

## 2012-11-15 NOTE — Progress Notes (Signed)
Passed Clock Drawing Test----given by Melina Modena, CMA

## 2012-11-15 NOTE — Progress Notes (Signed)
Patient ID: Kelly Irwin, female   DOB: October 22, 1927, 79 y.o.   MRN: [id number] [facility] [id number])  Code Status: DNR Contact Information   Name Relation Home Work Maguayo Spouse [phone]     Murcia,[name] Daughter [phone]       Chief Complaint  Patient presents with  . Annual Exam    Annual Exam --Patient has no Complaints    HPI: This is a 79 y.o. female resident of [facility], Assisted Living  section. This patient  has not had a hospitalization, serious illness or injury in the last year. Patient has demonstrated some decline in functional due to worsening anxiety. Requires significant time and assistance from AL staff for AM routine (dressing, grooming, personal hygeine). Spouse continues to provide assistance as well.  Patient continues weekly appointments with psychologist for counseling and regular followups with psychiatrist for psychotropic medication management. Chronic back pain due to spinal stenosis is well managed with current dose of methadone, however gait and balance have recently patient is currently participating with physical therapy interventions.  Patient's daughter has expressed some concern about this patient's memory, review of record shows this patient is forgetful at times, does not display other functional problems related to dementia. Review of record shows patient's blood pressure is well-controlled, all vital signs have been stable. Weight remains elevated. Recent laboratory studies are all satisfactory including thyroid function.  Functional Status Bathing: Maximal Assist Bladder Management: Continent, Bowel Management: Continent Feeding: Independent Hygiene and Grooming: Maximal Assist, Toileting / Clothing: Maximal Assist Walk: Moderate Independence, Walker    Allergies  Allergen Reactions  . Cozaar [Losartan Potassium]   . Doxycycline   . Lamictal [Lamotrigine]   . Neurontin  [Gabapentin]   . Nsaids    Medications    Medication List       This list is accurate as of: 11/15/12 11:59 PM.  Always use your most recent med list.               ammonium lactate 12 % lotion  Commonly known as:  LAC-HYDRIN  Apply topically. Apply to lower leg as needed for rash     ARIPiprazole 2 MG tablet  Commonly known as:  ABILIFY  Take 2 mg by mouth. Take 1/2 tablet at bedtime for depression     busPIRone 5 MG tablet  Commonly known as:  BUSPAR  Take 5 mg by mouth. Take one tablet in morning, may have extra 5mg  as needed during the day     CALCIUM 600+D3 600-400 MG-UNIT per tablet  Generic drug:  Calcium Carbonate-Vitamin D  Take 1 tablet by mouth daily. Take one tablet twice daily     CARRAVITE PO  Take by mouth. Take one tablet daily     estradiol 0.5 MG tablet  Commonly known as:  ESTRACE  Take one tablet Monday and Thursday for hormone supplement     levothyroxine 100 MCG tablet  Commonly known as:  SYNTHROID, LEVOTHROID  Take one tablet once a day for tyroid supplement     LORazepam 0.5 MG tablet  Commonly known as:  ATIVAN  Take 0.5 mg by mouth. Take one tablet at bedtime     Melatonin 3 MG Caps  Take by mouth. Take one capsule daily     methadone 5 MG tablet  Commonly known as:  DOLOPHINE  Take 1 tablet (5 mg total) by mouth as directed. Take 2 tablets at 9am and 1 1/2 tablet at 1pm  and 2 tablets at 9pm to treat chronic pain     metoprolol succinate 25 MG 24 hr tablet  Commonly known as:  TOPROL-XL  Take one tablet every day for blood pressure     nystatin-triamcinolone ointment  Commonly known as:  MYCOLOG  Apply topically. Apply daily to inflammatory folds under breast once a morning     omeprazole 20 MG capsule  Commonly known as:  PRILOSEC  Take one capsule twice daily to reduce stomach acid     polyethylene glycol packet  Commonly known as:  MIRALAX / GLYCOLAX  Take 17 g by mouth daily. For constipation. Hold for loose stool      sertraline 50 MG tablet  Commonly known as:  ZOLOFT  Take 50 mg by mouth daily. For depression     ZEASORB powder  Generic drug:  talc  Apply topically. Apply twice daily to areas under breast and on feet        DATA REVIEWED  Radiologic Exams:   [facility] List: 03/24/2010 CT Cervical spine Reversal of the normal cervical lordosis with associated 0.3 cm of anterolisthesis of C3 on C4 due to right much worse than left facet arthropathy.  Disc osteophyte complex at C5-6 results in some flattening of the ventral cord and left worse than right foraminal narrowing.  Disc bulge causes mild flattening of the ventral cord at C6-7 and mild left foraminal narrowing.            11/19/2010 CT Head 1.5 cm left frontal parietal meningioma and 6 mm right frontal meningioma. Neither exhibit significant mass effect or adjacent edema.   Cardiovascular Exams:   [facility] List:         11/10/2010 Venous Doppler No evidence of acute deep or superficial venous thrombosis noted in the bilateral femoral-popliteal venous systems, based on decreased visualization.  Unable to adequately visualize the bilateral calf veins due to edema.             Laboratory Studies:    11/11/2011 CBC: Wbc 8.4, Rbc 4.83, Hgb 13.0, Hct 38.7  CMP: Sodium 139, Potassium 5.1, glucose 93, BUN 20, Creatinine 0.86  Lipid: cholesterol 246, triglyceride 180, HDL 6.0, LDL 169  TSH 2.823   05/23/2012: Glucose 84, BUN 21, creatinine 0.4, sodium 137, potassium 4.1.  Total cholesterol 233, triglyceride 144, HDL 36, LDL 151  10/17/2012 CBC 8.5, hemoglobin 12.8, hematocrit 37.9, platelets 219  Glucose 89, BUN 15, creatinine 0.9 0.4, sodium 139, potassium 4.4. Protein/LFTs WNL.  TSH 1.075  B12 866   Past Medical History  Diagnosis Date  . Spinal stenosis, unspecified region other than cervical   . Pain in joint, shoulder region   . Unspecified essential hypertension   . Other and unspecified hyperlipidemia   . Acute upper respiratory infections  of unspecified site 04/28/2012  . Edema 02/09/2012  . Other specified erythematous condition(695.89) 01/24/2012  . Lumbago 12/27/2011  . Rash and other nonspecific skin eruption 12/27/2011  . Unspecified hypothyroidism 11/10/2011  . Anxiety state, unspecified 11/10/2011  . Depressive disorder, not elsewhere classified 11/10/2011  . Reflux esophagitis 11/10/2011  . Unspecified constipation 11/10/2011  . Osteoarthrosis, unspecified whether generalized or localized, unspecified site 11/10/2011  . Backache, unspecified 11/10/2011  . Unspecified urinary incontinence 11/10/2011  . Memory loss 12/18/2010  . Unspecified hypothyroidism    Past Surgical History  Procedure Laterality Date  . Back surgery      x3  . Rotator cuff repair Right   . Total abdominal hysterectomy w/  bilateral salpingoophorectomy  1972  . Cataract extraction w/ intraocular lens  implant, bilateral  2009-2010   Family History  Problem Relation Age of Onset  . Pneumonia Mother   . Alzheimer's disease Father    History   Social History Narrative   Patient is Married since 1950 Jake Shark).  Occupation: owned Designer, multimedia business (retitred)  Assisted Living section at [facility] since 08/[facility] . Lives with spouse.   Stopped smoking 1950s (smoked 20-3-yrs), Minimal alcohol history   Patient has  Advanced planning documents: Living Will, DNR               Review of Systems  DATA OBTAINED: from patient, medical record, family member (daughter, Elon Jester) GENERAL: Feels well   No fevers, fatigue, change in appetite or weight SKIN: No itch, rash or open wounds EYES: No eye pain, dryness or itching  No change in vision EARS: No earache, tinnitus, change in hearing NOSE: No congestion, drainage or bleeding MOUTH/THROAT: No mouth or tooth pain  No sore throat No difficulty chewing or swallowing RESPIRATORY: No cough, wheezing, SOB CARDIAC: No chest pain, palpitations  No edema. CHEST/BREASTS: No discomfort,  discharge or lumps in breasts GI: No abdominal pain  No N/V/D or constipation  No heartburn or reflux (takes medication) GU: No dysuria, frequency or urgency  No change in urine volume or character   MUSCULOSKELETAL: No joint pain, swelling or stiffness  No back pain  No muscle ache, pain, weakness  Gait is unsteady  No recent falls.  NEUROLOGIC: No dizziness, fainting, headache   No change in mental status.  PSYCHIATRIC: Has feelings of anxiety daily. Sleeps well.  No behavior issue.    Physical Exam Filed Vitals:   11/15/12 0953  BP: 136/80  Pulse: 59  Resp: 13  Height: 4\' 9"  (1.448 m)  Weight: 148 lb (67.132 kg)   Body mass index is 32.02 kg/(m^2).   GENERAL APPEARANCE: No acute distress, appropriately groomed, Obese body habitus. Alert, pleasant, conversant. SKIN: No diaphoresis, rash, wounds. Large flat, dry, dark keratotic lesions on chest (not new, unchanged) HEAD: Normocephalic, atraumatic EYES: Conjunctiva/lids clear. Pupils round, reactive. EOMs intact.  EARS: External exam WNL, canals clear, TM WNL. Hearing grossly normal. NOSE: No deformity or discharge. MOUTH/THROAT: Lips w/o lesions. Oral mucosa, tongue moist, w/o lesion. Oropharynx w/o redness or lesions.  NECK: Supple, full ROM. No thyroid tenderness, enlargement or nodule LYMPHATICS: No head, neck or supraclavicular adenopathy RESPIRATORY: Breathing is even, unlabored. Lung sounds are clear and full.  CARDIOVASCULAR: Heart RRR. No murmur or extra heart sounds  ARTERIAL: No carotid bruit. Carotid, DP pulse 2+. Femoral pulse not palpable bilateral  VENOUS: No varicosities. No venous stasis skin changes  EDEMA: No peripheral edema GASTROINTESTINAL: Abdomen is soft, non-tender, Obese w/ normal bowel sounds. No hepatic or splenic enlargement. No mass, ventral or inguinal hernia. GENITOURINARY: Bladder non tender, not distended. MUSCULOSKELETAL: decreased ROM rt. Shoulder, o/w Full ROM, strength and tone. Back is without  kyphosis, scoliosis or spinal process tenderness. Gait is unsteady NEUROLOGIC: Oriented to time, place, person. Cranial nerves 2-12 grossly intact, speech clear, Resting tremor rt. hand. Patella, brachial DTR 1+.  MMSE : 27/30, Passed Clock test  PSYCHIATRIC: Some anxiety/tearfulness re: MMSE, o/w Mood and affect appropriate to situation  ASSESSMENT/PLAN  Unspecified essential hypertension Blood pressure and pulse stable on current medications. Recent laboratory studies satisfactory.  Reflux esophagitis Reports very occasional reflux symptoms. Most recent B12 level satisfactory. Continue current medication  Unspecified hypothyroidism No signs of  overt hypothyroidism, recent TSH satisfactory, continue current supplement.  Spinal stenosis, unspecified region other than cervical Back pain management remained satisfactory with current medications. No adverse effects from methadone.  Anxiety state, unspecified Patient's anxiety is impacting her functional status. Currently assisted-living staff can manage patient's needs. Patient reports some shortness of breath related to her nerves, did review and demonstrate some breathing exercises which may be helpful. Recommend patient continues close followup with psychiatrist and psychologist, continue current medications.  Abnormality of gait Gait and balance problem multifactorial including chronic back pain and low activity level. Continue physical therapy interventions. Have recommended returning to scheduled exercise class.   Cognitive decline Nursing record shows patient is forgetful at times, daughter expresses concern with the patient's memory and general cognitive status. She feels she covers her deficit very well. MMSE today reveals mild cognitive decline. We'll continue to monitor patient and functional status. medication not indicated at this time   Follow up: 3 months  Kjirsten Bloodgood T.Jeda Pardue, NP-C 11/15/2012

## 2012-11-16 DIAGNOSIS — M48061 Spinal stenosis, lumbar region without neurogenic claudication: Secondary | ICD-10-CM | POA: Diagnosis not present

## 2012-11-16 DIAGNOSIS — R279 Unspecified lack of coordination: Secondary | ICD-10-CM | POA: Diagnosis not present

## 2012-11-16 DIAGNOSIS — R269 Unspecified abnormalities of gait and mobility: Secondary | ICD-10-CM | POA: Diagnosis not present

## 2012-11-17 DIAGNOSIS — R279 Unspecified lack of coordination: Secondary | ICD-10-CM | POA: Diagnosis not present

## 2012-11-17 DIAGNOSIS — R269 Unspecified abnormalities of gait and mobility: Secondary | ICD-10-CM | POA: Diagnosis not present

## 2012-11-17 DIAGNOSIS — M48061 Spinal stenosis, lumbar region without neurogenic claudication: Secondary | ICD-10-CM | POA: Diagnosis not present

## 2012-11-19 ENCOUNTER — Encounter: Payer: Self-pay | Admitting: Geriatric Medicine

## 2012-11-19 NOTE — Assessment & Plan Note (Signed)
Nursing record shows patient is forgetful at times, daughter expresses concern with the patient's memory and general cognitive status. She feels she covers her deficit very well. MMSE today reveals mild cognitive decline. We'll continue to monitor patient and functional status. medication not indicated at this time

## 2012-11-19 NOTE — Assessment & Plan Note (Signed)
Patient's anxiety is impacting her functional status. Currently assisted-living staff can manage patient's needs. Patient reports some shortness of breath related to her nerves, did review and demonstrate some breathing exercises which may be helpful. Recommend patient continues close followup with psychiatrist and psychologist, continue current medications.

## 2012-11-19 NOTE — Assessment & Plan Note (Signed)
No signs of overt hypothyroidism, recent TSH satisfactory, continue current supplement.

## 2012-11-19 NOTE — Assessment & Plan Note (Signed)
Reports very occasional reflux symptoms. Most recent B12 level satisfactory. Continue current medication

## 2012-11-19 NOTE — Assessment & Plan Note (Signed)
Blood pressure and pulse stable on current medications. Recent laboratory studies satisfactory.

## 2012-11-19 NOTE — Assessment & Plan Note (Signed)
Gait and balance problem multifactorial including chronic back pain and low activity level. Continue physical therapy interventions. Have recommended returning to scheduled exercise class.

## 2012-11-19 NOTE — Assessment & Plan Note (Signed)
Back pain management remained satisfactory with current medications. No adverse effects from methadone.

## 2012-11-20 DIAGNOSIS — M48061 Spinal stenosis, lumbar region without neurogenic claudication: Secondary | ICD-10-CM | POA: Diagnosis not present

## 2012-11-20 DIAGNOSIS — R279 Unspecified lack of coordination: Secondary | ICD-10-CM | POA: Diagnosis not present

## 2012-11-20 DIAGNOSIS — R269 Unspecified abnormalities of gait and mobility: Secondary | ICD-10-CM | POA: Diagnosis not present

## 2012-11-21 DIAGNOSIS — F331 Major depressive disorder, recurrent, moderate: Secondary | ICD-10-CM | POA: Diagnosis not present

## 2012-11-23 DIAGNOSIS — R279 Unspecified lack of coordination: Secondary | ICD-10-CM | POA: Diagnosis not present

## 2012-11-23 DIAGNOSIS — M48061 Spinal stenosis, lumbar region without neurogenic claudication: Secondary | ICD-10-CM | POA: Diagnosis not present

## 2012-11-23 DIAGNOSIS — R269 Unspecified abnormalities of gait and mobility: Secondary | ICD-10-CM | POA: Diagnosis not present

## 2012-11-28 DIAGNOSIS — F331 Major depressive disorder, recurrent, moderate: Secondary | ICD-10-CM | POA: Diagnosis not present

## 2012-12-05 DIAGNOSIS — F331 Major depressive disorder, recurrent, moderate: Secondary | ICD-10-CM | POA: Diagnosis not present

## 2012-12-08 ENCOUNTER — Other Ambulatory Visit: Payer: Self-pay | Admitting: Geriatric Medicine

## 2012-12-08 MED ORDER — METHADONE HCL 5 MG PO TABS: 5.0000 mg | ORAL_TABLET | ORAL | Status: DC

## 2012-12-12 DIAGNOSIS — F331 Major depressive disorder, recurrent, moderate: Secondary | ICD-10-CM | POA: Diagnosis not present

## 2012-12-18 DIAGNOSIS — F331 Major depressive disorder, recurrent, moderate: Secondary | ICD-10-CM | POA: Diagnosis not present

## 2012-12-21 DIAGNOSIS — L97509 Non-pressure chronic ulcer of other part of unspecified foot with unspecified severity: Secondary | ICD-10-CM | POA: Diagnosis not present

## 2012-12-26 DIAGNOSIS — F331 Major depressive disorder, recurrent, moderate: Secondary | ICD-10-CM | POA: Diagnosis not present

## 2013-01-02 DIAGNOSIS — F331 Major depressive disorder, recurrent, moderate: Secondary | ICD-10-CM | POA: Diagnosis not present

## 2013-01-08 ENCOUNTER — Other Ambulatory Visit: Payer: Self-pay | Admitting: Geriatric Medicine

## 2013-01-08 DIAGNOSIS — M4800 Spinal stenosis, site unspecified: Secondary | ICD-10-CM

## 2013-01-08 MED ORDER — METHADONE HCL 5 MG PO TABS: 5.0000 mg | ORAL_TABLET | ORAL | Status: DC

## 2013-01-09 DIAGNOSIS — F331 Major depressive disorder, recurrent, moderate: Secondary | ICD-10-CM | POA: Diagnosis not present

## 2013-01-16 DIAGNOSIS — F331 Major depressive disorder, recurrent, moderate: Secondary | ICD-10-CM | POA: Diagnosis not present

## 2013-01-16 DIAGNOSIS — Z79899 Other long term (current) drug therapy: Secondary | ICD-10-CM | POA: Diagnosis not present

## 2013-01-23 DIAGNOSIS — F331 Major depressive disorder, recurrent, moderate: Secondary | ICD-10-CM | POA: Diagnosis not present

## 2013-01-30 DIAGNOSIS — F331 Major depressive disorder, recurrent, moderate: Secondary | ICD-10-CM | POA: Diagnosis not present

## 2013-02-05 ENCOUNTER — Other Ambulatory Visit: Payer: Self-pay | Admitting: *Deleted

## 2013-02-05 ENCOUNTER — Other Ambulatory Visit: Payer: Self-pay | Admitting: Geriatric Medicine

## 2013-02-05 DIAGNOSIS — M4800 Spinal stenosis, site unspecified: Secondary | ICD-10-CM

## 2013-02-05 MED ORDER — OMEPRAZOLE 20 MG PO CPDR: DELAYED_RELEASE_CAPSULE | ORAL | Status: DC

## 2013-02-05 MED ORDER — METHADONE HCL 5 MG PO TABS: 5.0000 mg | ORAL_TABLET | ORAL | Status: DC

## 2013-02-06 DIAGNOSIS — F331 Major depressive disorder, recurrent, moderate: Secondary | ICD-10-CM | POA: Diagnosis not present

## 2013-02-07 ENCOUNTER — Other Ambulatory Visit: Payer: Self-pay

## 2013-02-07 MED ORDER — ESTRADIOL 0.5 MG PO TABS: ORAL_TABLET | ORAL | Status: DC

## 2013-02-13 DIAGNOSIS — F331 Major depressive disorder, recurrent, moderate: Secondary | ICD-10-CM | POA: Diagnosis not present

## 2013-02-14 ENCOUNTER — Non-Acute Institutional Stay: Payer: Medicare Other | Admitting: Geriatric Medicine

## 2013-02-14 ENCOUNTER — Encounter: Payer: Self-pay | Admitting: Geriatric Medicine

## 2013-02-14 VITALS — BP 118/64 | HR 64 | Ht <= 58 in | Wt 149.0 lb

## 2013-02-14 DIAGNOSIS — M4800 Spinal stenosis, site unspecified: Secondary | ICD-10-CM

## 2013-02-14 DIAGNOSIS — I10 Essential (primary) hypertension: Secondary | ICD-10-CM

## 2013-02-14 DIAGNOSIS — R269 Unspecified abnormalities of gait and mobility: Secondary | ICD-10-CM

## 2013-02-14 NOTE — Progress Notes (Signed)
Patient ID: Kelly Irwin, female   DOB: 1928-01-01, 79 y.o.   MRN: [id number]  [facility] [phone])  Code Status: DNR     Contact Information   Name Relation Home Work [place] Spouse [phone]     Irwin,Kelly Daughter [phone]       Chief Complaint  Patient presents with  . Medical Managment of Chronic Issues    blood pressure, gait, back pain    HPI: This is a 79 y.o. female resident of [facility], Independent Living section evaluated today for management of ongoing medical issues.   Last visit: Unspecified essential hypertension Blood pressure and pulse stable on current medications. Recent laboratory studies satisfactory.  Spinal stenosis, unspecified region other than cervical Back pain management remained satisfactory with current medications. No adverse effects from methadone.  Anxiety state, unspecified Patient's anxiety is impacting her functional status. Currently assisted-living staff can manage patient's needs. Patient reports some shortness of breath related to her nerves, did review and demonstrate some breathing exercises which may be helpful. Recommend patient continues close followup with psychiatrist and psychologist, continue current medications.  Abnormality of gait Gait and balance problem multifactorial including chronic back pain and low activity level. Continue physical therapy interventions. Have recommended returning to scheduled exercise class.   Cognitive decline Nursing record shows patient is forgetful at times, daughter expresses concern with the patient's memory and general cognitive status. She feels she covers her deficit very well. MMSE today reveals mild cognitive decline. Will continue to monitor patient and functional status. Medication not indicated at this time  Since last visit this patient's blood pressure has remained stable per reports and facility record. The  patient has completed PT services is now working with a Systems analyst , Estrellita Ludwig once a week. She's also attending exercise classes 3 times a week. Patient has had no falls, feels she's a little bit stronger. The patient's chronic back pain is unchanged, not worse. She reports that her medication is satisfactory and os are 2 manage her daily activities. Patient has been to see Dr. Caryn Bee, podiatrist regarding a painful toe. She has a corn on the left second toe and hammertoe which it is making it difficult for her to wear shoes. She is due to return to the podiatrist soon. Constipation continues to be an intermittent problem for this patient, she takes MiraLax, small dose daily has recently required suppository. Patient's most recent lab work was satisfactory.  Functional Status Bathing: Maximal Assist Bladder Management: Continent, Bowel Management: Continent Feeding: Independent Hygiene and Grooming: Maximal Assist, Toileting / Clothing: Maximal Assist Walk: Moderate Independence, Walker    Allergies  Allergen Reactions  . Cozaar [Losartan Potassium]   . Doxycycline   . Lamictal [Lamotrigine]   . Neurontin [Gabapentin]   . Nsaids       Medication List       This list is accurate as of: 02/14/13  2:47 PM.  Always use your most recent med list.               ammonium lactate 12 % lotion  Commonly known as:  LAC-HYDRIN  Apply topically. Apply to lower leg as needed for rash     ARIPiprazole 2 MG tablet  Commonly known as:  ABILIFY  Take 2 mg by mouth. Take 1 tablet at bedtime for depression     busPIRone 5 MG tablet  Commonly known as:  BUSPAR  Take 5 mg by mouth. Take one  tablet in morning, may have extra 5mg  as needed during the day     CALCIUM 600+D3 600-400 MG-UNIT per tablet  Generic drug:  Calcium Carbonate-Vitamin D  Take 1 tablet by mouth daily. Take one tablet  daily     CARRAVITE PO  Take by mouth. Take one tablet daily     estradiol 0.5 MG tablet    Commonly known as:  ESTRACE  Take one tablet Monday and Thursday for hormone supplement     levothyroxine 100 MCG tablet  Commonly known as:  SYNTHROID, LEVOTHROID  Take one tablet once a day for tyroid supplement     LORazepam 0.5 MG tablet  Commonly known as:  ATIVAN  Take 0.5 mg by mouth. Take one tablet at bedtime     Melatonin 3 MG Caps  Take by mouth. Take one capsule daily     methadone 5 MG tablet  Commonly known as:  DOLOPHINE  Take 5 mg by mouth as directed. Take 2 tableta at 9am and 1 1/2 tablet at 1pm and 2 tablets at 9pm to treat chronic pain     metoprolol succinate 25 MG 24 hr tablet  Commonly known as:  TOPROL-XL  Take one tablet every day for blood pressure     nystatin-triamcinolone ointment  Commonly known as:  MYCOLOG  Apply topically. Apply daily to inflammatory folds under breast once a morning     omeprazole 20 MG capsule  Commonly known as:  PRILOSEC  Take one capsule twice daily to reduce stomach acid     polyethylene glycol packet  Commonly known as:  MIRALAX / GLYCOLAX  Take 17 g by mouth daily. For constipation. Hold for loose stool     sertraline 50 MG tablet  Commonly known as:  ZOLOFT  Take 50 mg by mouth daily. For depression     ZEASORB powder  Generic drug:  talc  Apply topically. Apply twice daily to areas under breast and on feet        DATA REVIEWED  Radiologic Exams:   [facility] List: 03/24/2010 CT Cervical spine Reversal of the normal cervical lordosis with associated 0.3 cm of anterolisthesis of C3 on C4 due to right much worse than left facet arthropathy.  Disc osteophyte complex at C5-6 results in some flattening of the ventral cord and left worse than right foraminal narrowing.  Disc bulge causes mild flattening of the ventral cord at C6-7 and mild left foraminal narrowing.            11/19/2010 CT Head 1.5 cm left frontal parietal meningioma and 6 mm right frontal meningioma. Neither exhibit significant mass effect or adjacent  edema.   Cardiovascular Exams:   [facility]ysis List:         11/10/2010 Venous Doppler No evidence of acute deep or superficial venous thrombosis noted in the bilateral femoral-popliteal venous systems, based on decreased visualization.  Unable to adequately visualize the bilateral calf veins due to edema.             Laboratory Studies:    11/11/2011 CBC: Wbc 8.4, Rbc 4.83, Hgb 13.0, Hct 38.7  CMP: Sodium 139, Potassium 5.1, glucose 93, BUN 20, Creatinine 0.86  Lipid: cholesterol 246, triglyceride 180, HDL 6.0, LDL 169  TSH 2.823   05/23/2012: Glucose 84, BUN 21, creatinine 0.4, sodium 137, potassium 4.1.  Total cholesterol 233, triglyceride 144, HDL 36, LDL 151  10/17/2012 CBC 8.5, hemoglobin 12.8, hematocrit 37.9, platelets 219  Glucose 89, BUN 15, creatinine 0.9  0, sodium 139, potassium 4.4. Protein/LFTs WNL.  TSH 1.075  B12 866 Lab Results- [facility]  Component Value Date   NA 138 01/16/2013   K 4.3 01/16/2013   CREATININE 0.8 01/16/2013   BUN 18 01/16/2013   GLUCOSE 98 01/16/2013     Review of Systems  DATA OBTAINED: from patient, medical record,  GENERAL: Feels well   No fevers, fatigue, change in appetite or weight SKIN: No itch, rash or open wounds EYES: No eye pain, dryness or itching  No change in vision EARS: No earache, tinnitus, change in hearing NOSE: No congestion, drainage or bleeding MOUTH/THROAT: No mouth or tooth pain  No sore throat No difficulty chewing or swallowing RESPIRATORY: No cough, wheezing, SOB CARDIAC: No chest pain, palpitations  No edema. CHEST/BREASTS: No discomfort, discharge or lumps in breasts GI: No abdominal pain  No N/V/D or constipation  No heartburn or reflux (takes medication) GU: No dysuria, frequency or urgency  No change in urine volume or character   MUSCULOSKELETAL: No joint pain, swelling or stiffness  No back pain  No muscle ache, pain, weakness  Gait is unsteady  No recent falls.  NEUROLOGIC: No dizziness, fainting, headache   No change  in mental status.  PSYCHIATRIC: Has feelings of anxiety daily. Sleeps well.  No behavior issue.    Physical Exam Filed Vitals:   02/14/13 1436  BP: 118/64  Pulse: 64  Height: 4\' 9"  (1.448 m)  Weight: 149 lb (67.586 kg)   Body mass index is 32.23 kg/(m^2).   GENERAL APPEARANCE: No acute distress, appropriately groomed, Obese body habitus. Alert, pleasant, conversant. SKIN: No diaphoresis, rash, wounds. Large flat, dry, dark keratotic lesions on chest (not new, unchanged) HEAD: Normocephalic, atraumatic EYES: Conjunctiva/lids clear. Pupils round, reactive. EARS:  Hearing grossly normal. NOSE: No deformity or discharge. MOUTH/THROAT: Lips w/o lesions. Oral mucosa, tongue moist, w/o lesion. Oropharynx w/o redness or lesions.  NECK: Supple, full ROM. No thyroid tenderness, enlargement or nodule LYMPHATICS: No head, neck or supraclavicular adenopathy RESPIRATORY: Breathing is even, unlabored. Lung sounds are clear and full.  CARDIOVASCULAR: Heart RRR. No murmur or extra heart sound  EDEMA: No peripheral edema GASTROINTESTINAL: Abdomen is soft, non-tender, Obese w/ normal bowel sounds.  MUSCULOSKELETAL: decreased ROM rt. Shoulder, o/w Full ROM, strength and tone. Back is without kyphosis, scoliosis or spinal process tenderness. Gait is unsteady NEUROLOGIC: Oriented to time, place, person. Cranial nerves 2-12 grossly intact, speech clear, Resting tremor rt. hand.  PSYCHIATRIC: Mood and behavior appropriate to situation  ASSESSMENT/PLAN  Unspecified essential hypertension Blood pressure remained stable current medications, recent labs satisfactory.  Abnormality of gait Gait and general strength improved with regular exercise. Have encouraged patient to maintain her 3 times a week exercise class as well as exercises between workouts with personal trainer  Spinal stenosis, unspecified region other than cervical Chronic back pain is unchanged, current medications allow patient to maintain  daily activities including ambulation and exercise   Follow up: 3 months  Cilicia Borden T.Arnetta Odeh, NP-C 02/14/2013

## 2013-02-19 NOTE — Assessment & Plan Note (Signed)
Chronic back pain is unchanged, current medications allow patient to maintain daily activities including ambulation and exercise

## 2013-02-19 NOTE — Assessment & Plan Note (Signed)
Gait and general strength improved with regular exercise. Have encouraged patient to maintain her 3 times a week exercise class as well as exercises between workouts with personal trainer

## 2013-02-19 NOTE — Assessment & Plan Note (Signed)
Blood pressure remained stable current medications, recent labs satisfactory.

## 2013-02-20 DIAGNOSIS — F331 Major depressive disorder, recurrent, moderate: Secondary | ICD-10-CM | POA: Diagnosis not present

## 2013-02-27 DIAGNOSIS — F331 Major depressive disorder, recurrent, moderate: Secondary | ICD-10-CM | POA: Diagnosis not present

## 2013-03-06 DIAGNOSIS — F331 Major depressive disorder, recurrent, moderate: Secondary | ICD-10-CM | POA: Diagnosis not present

## 2013-03-07 ENCOUNTER — Other Ambulatory Visit: Payer: Self-pay | Admitting: Geriatric Medicine

## 2013-03-07 MED ORDER — METHADONE HCL 5 MG PO TABS: 5.0000 mg | ORAL_TABLET | ORAL | Status: DC

## 2013-03-20 ENCOUNTER — Other Ambulatory Visit: Payer: Self-pay | Admitting: *Deleted

## 2013-03-20 DIAGNOSIS — F331 Major depressive disorder, recurrent, moderate: Secondary | ICD-10-CM | POA: Diagnosis not present

## 2013-03-20 MED ORDER — LEVOTHYROXINE SODIUM 100 MCG PO TABS: ORAL_TABLET | ORAL | Status: DC

## 2013-03-27 DIAGNOSIS — F331 Major depressive disorder, recurrent, moderate: Secondary | ICD-10-CM | POA: Diagnosis not present

## 2013-04-05 ENCOUNTER — Other Ambulatory Visit: Payer: Self-pay | Admitting: Geriatric Medicine

## 2013-04-05 MED ORDER — METHADONE HCL 5 MG PO TABS: 5.0000 mg | ORAL_TABLET | ORAL | Status: DC

## 2013-04-10 DIAGNOSIS — F331 Major depressive disorder, recurrent, moderate: Secondary | ICD-10-CM | POA: Diagnosis not present

## 2013-04-17 DIAGNOSIS — F331 Major depressive disorder, recurrent, moderate: Secondary | ICD-10-CM | POA: Diagnosis not present

## 2013-04-24 DIAGNOSIS — F331 Major depressive disorder, recurrent, moderate: Secondary | ICD-10-CM | POA: Diagnosis not present

## 2013-05-10 ENCOUNTER — Non-Acute Institutional Stay: Payer: Medicare Other | Admitting: Geriatric Medicine

## 2013-05-10 ENCOUNTER — Encounter: Payer: Self-pay | Admitting: Geriatric Medicine

## 2013-05-10 DIAGNOSIS — R4182 Altered mental status, unspecified: Secondary | ICD-10-CM | POA: Diagnosis not present

## 2013-05-10 DIAGNOSIS — N390 Urinary tract infection, site not specified: Secondary | ICD-10-CM | POA: Diagnosis not present

## 2013-05-10 DIAGNOSIS — R05 Cough: Secondary | ICD-10-CM | POA: Diagnosis not present

## 2013-05-10 DIAGNOSIS — R509 Fever, unspecified: Secondary | ICD-10-CM | POA: Diagnosis not present

## 2013-05-10 DIAGNOSIS — R059 Cough, unspecified: Secondary | ICD-10-CM | POA: Diagnosis not present

## 2013-05-10 LAB — BASIC METABOLIC PANEL
BUN: 15 mg/dL (ref 4–21)
Creatinine: 0.9 mg/dL (ref 0.5–1.1)
Glucose: 123 mg/dL
Potassium: 6.4 mmol/L — AB (ref 3.4–5.3)
SODIUM: 132 mmol/L — AB (ref 137–147)

## 2013-05-10 LAB — CBC AND DIFFERENTIAL
HEMATOCRIT: 39 % (ref 36–46)
Hemoglobin: 13.4 g/dL (ref 12.0–16.0)
Platelets: 126 10*3/uL — AB (ref 150–399)
WBC: 5.7 10^3/mL

## 2013-05-10 NOTE — Progress Notes (Signed)
Patient ID: Kelly Irwin, female   DOB: 1928/02/16, 80 y.o.   MRN: [id number]   [facility] [phone])  Code Status: DNR   Contact Information   Name Relation Home Work [place] Spouse [phone]     Mcbeth,[name] Daughter [phone]         Chief Complaint  Patient presents with  . AMS  . Urinary Incontinence  . Fever    HPI: This is a 80 y.o. female resident of [place], Assisted Living section.  Evaluation is requested today due to the onset of disorientation, worsening urinary incontinence, low-grade fever. There is no is difficult to carry on a conversation as this patient continuously repeats words. This is a significant change from her baseline.  Nurse reports this patient's activity level and mental status at usual levels as of yesterday. She has been eating and drinking as usual as well.    Allergies  Allergen Reactions  . Cozaar [Losartan Potassium]   . Doxycycline   . Lamictal [Lamotrigine]   . Neurontin [Gabapentin]   . Nsaids     MEDICATIONS -  Reviewed  DATA REVIEWED  Radiologic Exams:   Laboratory Studies:   REVIEW OF SYSTEMS  DATA OBTAINED: from patient, nurse GENERAL: "I haven't felt well for about a week". See history of present illness  SKIN: No itch, rash or open wounds NOSE: No congestion, drainage or bleeding MOUTH/THROAT: No mouth or tooth pain    No sore throat   No difficulty chewing or swallowing RESPIRATORY: Some coughing reported in the last few days  No wheezing, SOB CARDIAC: No chest pain, palpitations  No edema. GI: No abdominal pain   No nausea, vomiting,diarrhea or constipation  No heartburn or reflux  GU: No dysuria.  Chronic incontinence, patient usually manages this well. Today, complete urine incontinence with bedclothes and sheets soaked  MUSCULOSKELETAL: No joint pain, swelling or stiffness  No new or worsening back pain  No muscle ache, pain, weakness    Gait is  unsteady  No recent falls.  NEUROLOGIC: No dizziness, fainting, headache, numbness  see history of present illness PSYCHIATRIC: Chronic anxiety, worse in the last few days with spouse's acute illness    PHYSICAL EXAM Filed Vitals:   05/10/13 1237  BP: 176/74  Pulse: 86  Temp: 99.2 F (37.3 C)  Resp: 20  SpO2: 90%   There is no weight on file to calculate BMI.  GENERAL APPEARANCE: No acute distress, appropriately groomed, Overweight body habitus. Alert, pleasant, general demeanor is blunted, responses are slower than usual SKIN: No diaphoresis, rash,  HEAD: Normocephalic, atraumatic EYES: Conjunctiva/lids clear. Pupils round, reactive.Marland Kitchen  EARS: Poor Hearing  NOSE: No deformity or discharge. MOUTH/THROAT: Lips w/o lesions. Oral mucosa, tongue moist, w/o lesion. Oropharynx w/o redness or lesions.  NECK: Supple, full ROM. No thyroid tenderness, enlargement or nodule LYMPHATICS: No head, neck or supraclavicular adenopathy RESPIRATORY: Breathing is even, unlabored. Lung sounds are diminished at bases   CARDIOVASCULAR: Heart RRR. No murmur or extra heart sounds  EDEMA: No peripheral edema.  GASTROINTESTINAL: Abdomen is  Obese, soft, non-tender, not distended w/ normal bowel sounds.  GENITOURINARY: Bladder w/mild tenderness, not distended. Strong, foul urine odor evident MUSCULOSKELETAL: Requires assistance to stand, ambulation is unsteady even with walker, requires assistance to sit and reposition in bed  NEUROLOGIC: Disoriented to place, Oriented to person. Speech clear, slower than usual, no tremor. PSYCHIATRIC: Distracted   ASSESSMENT/PLAN  Altered mental status This usually alert and cognitively  intact patient is disoriented, has worsening incontinence and a low-grade fever. Breath sounds are diminished at bases and O2 saturation mildly decreased. Send urine for analysis, obtain chest x-ray. The nursing staff push fluids, continue to monitor vital signs for any changes.    Family/  staff Communication:  Discussed plan and need for monitoring with nursing staff. Daughter has been informed of changes and plan.   Labs/tests ordered: U/A, culture. CXR.   Follow up: AS needed  Mardene Celeste, NP-C [facility] [phone]  05/10/2013

## 2013-05-10 NOTE — Assessment & Plan Note (Signed)
This usually alert and cognitively intact patient is disoriented, has worsening incontinence and a low-grade fever. Breath sounds are diminished at bases and O2 saturation mildly decreased. Send urine for analysis, obtain chest x-ray. The nursing staff push fluids, continue to monitor vital signs for any changes.

## 2013-05-11 ENCOUNTER — Encounter: Payer: Self-pay | Admitting: Geriatric Medicine

## 2013-05-11 ENCOUNTER — Non-Acute Institutional Stay (SKILLED_NURSING_FACILITY): Payer: Medicare Other | Admitting: Geriatric Medicine

## 2013-05-11 DIAGNOSIS — R509 Fever, unspecified: Secondary | ICD-10-CM | POA: Diagnosis not present

## 2013-05-11 DIAGNOSIS — E878 Other disorders of electrolyte and fluid balance, not elsewhere classified: Secondary | ICD-10-CM | POA: Diagnosis not present

## 2013-05-11 DIAGNOSIS — R059 Cough, unspecified: Secondary | ICD-10-CM | POA: Diagnosis not present

## 2013-05-11 DIAGNOSIS — R4182 Altered mental status, unspecified: Secondary | ICD-10-CM

## 2013-05-11 DIAGNOSIS — R062 Wheezing: Secondary | ICD-10-CM | POA: Diagnosis not present

## 2013-05-11 DIAGNOSIS — E871 Hypo-osmolality and hyponatremia: Secondary | ICD-10-CM | POA: Diagnosis not present

## 2013-05-11 DIAGNOSIS — R05 Cough: Secondary | ICD-10-CM | POA: Diagnosis not present

## 2013-05-11 LAB — CBC AND DIFFERENTIAL
HEMATOCRIT: 37 % (ref 36–46)
HEMOGLOBIN: 12.8 g/dL (ref 12.0–16.0)
Platelets: 133 10*3/uL — AB (ref 150–399)
WBC: 4.6 10*3/mL

## 2013-05-11 LAB — BASIC METABOLIC PANEL
BUN: 13 mg/dL (ref 4–21)
Creatinine: 0.8 mg/dL (ref 0.5–1.1)
GLUCOSE: 106 mg/dL
POTASSIUM: 4.2 mmol/L (ref 3.4–5.3)
Sodium: 133 mmol/L — AB (ref 137–147)

## 2013-05-11 NOTE — Progress Notes (Signed)
Patient ID: Kelly Irwin, female   DOB: 1927-10-03, 80 y.o.   MRN: [id number]     [facility] [phone])  Code Status: DNR   Contact Information   Name Relation Home Work [place] Spouse [phone]     Elbe,[name] Daughter [phone]         Chief Complaint  Patient presents with  . F/U AMS    HPI: This is a 80 y.o. female resident of [place], Assisted Living section evaluated yesterday due to disorientation, worsening urinary incontinence, low-grade fever.  Last visit: Altered mental status This usually alert and cognitively intact patient is disoriented, has worsening incontinence and a low-grade fever. Breath sounds are diminished at bases and O2 saturation mildly decreased. Send urine for analysis, obtain chest x-ray. The nursing staff push fluids, continue to monitor vital signs for any changes.  Patient was transferred to Rehab section for closer monitoring/ nursing care. Her PO intake was poor all day; IVF were started last evening.  CXR returned without significant abnormality. Patient is more alert today, eating/ drinking better. LAb returned with elevated K+, likey due to hemolysis. EKG was done which showed NSR, no significant abnormality. U/A negative for infection.  Allergies  Allergen Reactions  . Cozaar [Losartan Potassium]   . Doxycycline   . Lamictal [Lamotrigine]   . Neurontin [Gabapentin]   . Nsaids     MEDICATIONS -  Reviewed  DATA REVIEWED  Radiologic Exams  [facility] X-ray 05/11/12 CXR:  No cardiomegaly,pulmonary vascular congestion or effusion. No inflammatory consolidate or suspicious nodule  Laboratory Studies: Lab Results  Component Value Date   WBC 5.7 05/10/2013   HGB 13.4 05/10/2013   HCT 39 05/10/2013   PLT 126* 05/10/2013   Lab Results  Component Value Date   NA 132* 05/10/2013   K 6.4* 05/10/2013   GLU 123 05/10/2013   BUN 15 05/10/2013   CREATININE 0.9 05/10/2013    U/A: Clear, neagtive nitrite, leuk.esterase, no bacteria   REVIEW OF SYSTEMS  DATA OBTAINED: from patient, nurse GENERAL: "I feel better than yesterday". See history of present illness  SKIN: No itch, rash or open wounds NOSE: No congestion, drainage or bleeding MOUTH/THROAT: No mouth or tooth pain    No sore throat   No difficulty chewing or swallowing RESPIRATORY: Coughing   No wheezing, SOB CARDIAC: No chest pain, palpitations  No edema. GI: No abdominal pain   No nausea, vomiting,diarrhea or constipation  No heartburn or reflux  GU: No dysuria.  Chronic incontinence MUSCULOSKELETAL: No joint pain, swelling or stiffness  No new or worsening back pain  No muscle ache, pain, weakness    Gait is unsteady  No recent falls.  NEUROLOGIC: No dizziness, fainting, headache, numbness  see history of present illness PSYCHIATRIC: Chronic anxiety, worse in the last few days with spouse's acute illness    PHYSICAL EXAM Filed Vitals:   05/11/13 1504  BP: 158/73  Pulse: 74  Temp: 99.5 F (37.5 C)  Resp: 20  SpO2: 98%   There is no weight on file to calculate BMI.  GENERAL APPEARANCE: No acute distress, appropriately groomed, Overweight body habitus. Alert, pleasant, conversant  SKIN: No diaphoresis, rash,  HEAD: Normocephalic, atraumatic EYES: Conjunctiva/lids clear. Pupils round, reactive.Marland Kitchen  EARS: Poor Hearing  NOSE: No deformity or discharge. MOUTH/THROAT: Lips w/o lesions. Oral mucosa, tongue moist, w/o lesion. Oropharynx w/o redness or lesions.  NECK: Supple, full ROM. No thyroid tenderness, enlargement or nodule LYMPHATICS: No  head, neck or supraclavicular adenopathy RESPIRATORY: Breathing is even, unlabored. Lungs with wheeze bilaterally anterior/posterior CARDIOVASCULAR: Heart RRR. No murmur or extra heart sounds  EDEMA: No peripheral edema.  GASTROINTESTINAL: Abdomen is  Obese, soft, non-tender, not distended w/ normal bowel sounds.   MUSCULOSKELETAL: Requires assistance to  stand, ambulation is unsteady even with walker, requires assistance to sit  NEUROLOGIC: Oriented to time, place, person. Speech clear, no tremor.  PSYCHIATRIC: Mood and affect appropriate to situation  ASSESSMENT/PLAN  Altered mental status Mental status much improved after IVF.   Hyponatremia Low Na and elevated K+ -specimen with slight hemolysis- repeat today.  Wheeze CXR yesterday without abnormality, today chest with significant wheeze. No obvious sign of volume overload. May be bronchospasm re: bronchitis. Start Duoneb tx, repeat CXR in AM.    Family/ staff Communication:  Daughter at bedside for visit, agrees w/ plan  Labs/tests ordered: CXR 05/12/13   Follow up: AS needed  Mardene Celeste, NP-C [place] [phone]  05/11/2013

## 2013-05-11 NOTE — Assessment & Plan Note (Signed)
Mental status much improved after IVF.

## 2013-05-11 NOTE — Assessment & Plan Note (Signed)
Low Na and elevated K+ -specimen with slight hemolysis- repeat today.

## 2013-05-11 NOTE — Assessment & Plan Note (Signed)
CXR yesterday without abnormality, today chest with significant wheeze. No obvious sign of volume overload. May be bronchospasm re: bronchitis. Start Duoneb tx, repeat CXR in AM.

## 2013-05-12 DIAGNOSIS — R062 Wheezing: Secondary | ICD-10-CM | POA: Diagnosis not present

## 2013-05-14 ENCOUNTER — Encounter: Payer: Self-pay | Admitting: *Deleted

## 2013-05-17 ENCOUNTER — Non-Acute Institutional Stay: Payer: Medicare Other | Admitting: Geriatric Medicine

## 2013-05-17 ENCOUNTER — Encounter: Payer: Self-pay | Admitting: Geriatric Medicine

## 2013-05-17 DIAGNOSIS — N390 Urinary tract infection, site not specified: Secondary | ICD-10-CM | POA: Diagnosis not present

## 2013-05-17 DIAGNOSIS — J209 Acute bronchitis, unspecified: Secondary | ICD-10-CM | POA: Diagnosis not present

## 2013-05-17 NOTE — Progress Notes (Signed)
Patient ID: Kelly Irwin, female   DOB: [birth year], 80 y.o.   MRN: [id number]     [place] [phone])  Code Status: DNR   Contact Information   Name Relation Home Work [place] Spouse [phone]     Coury,[name] Daughter [phone]         Chief Complaint  Patient presents with  . Urinary Tract Infection  . URI    HPI: This is a 80 y.o. female resident of [facility], [facility]isted [facility] section was transferred to the rehabilitation section last week due to disorientation, worsening urinary incontinence, low-grade fever.  Last visit: Altered mental status Mental status much improved after IVF.   Hyponatremia Low Na and elevated K+ -specimen with slight hemolysis- repeat today.  Wheeze CXR yesterday without abnormality, today chest with significant wheeze. No obvious sign of volume overload. May be bronchospasm re: bronchitis. Start Duoneb tx, repeat CXR in AM.  Since last visit patient was started on a course of Levaquin for respiratory infection. Antibiotic was changed to ampicillin when results of urine culture were returned positive for Escherichia coli that was resistant to Cipro and levofloxacin. Patient's overall condition improved, she was transferred back to her sister living apartment on Monday, March 2. She continues to feel kind of tired, is eating and drinking fairly well per her caregiver report. Review of facility record shows patient's vital signs have been stable; she remained afebrile.  Allergies  Allergen Reactions  . Cozaar [Losartan Potassium]   . Doxycycline   . Lamictal [Lamotrigine]   . Neurontin [Gabapentin]   . Nsaids     MEDICATIONS -  Reviewed  DATA REVIEWED  Radiologic Exams  [facility] X-ray 05/10/12 CXR:  No cardiomegaly,pulmonary vascular congestion or effusion. No inflammatory consolidate or suspicious nodule 05/12/2012 CXR: Evidence of bilateral bronchitis. Mild airspace  opacity bilateral lower lobes left greater than right  Laboratory Studies: Lab Results  Component Value Date   WBC 4.6 05/11/2013   HGB 12.8 05/11/2013   HCT 37 05/11/2013   PLT 133* 05/11/2013   Lab Results  Component Value Date   NA 133* 05/11/2013   K 4.2 05/11/2013   GLU 106 05/11/2013   BUN 13 05/11/2013   CREATININE 0.8 05/11/2013   05/10/1013: U/A: Clear, negtive nitrite, leuk.esterase, no bacteria  Culture returned 05/14/13: >100,000 E.coli, resistant to cipro/levofloxacin   REVIEW OF SYSTEMS  DATA OBTAINED: from patient, nurse GENERAL: "I feel pretty good..more worried about my husband".   SKIN: No itch, rash or open wounds NOSE: No congestion, drainage or bleeding MOUTH/THROAT: No mouth or tooth pain    No sore throat   No difficulty chewing or swallowing RESPIRATORY: Less Coughing   No wheezing, SOB CARDIAC: No chest pain, palpitations  No edema. GI: No abdominal pain   No nausea, vomiting,diarrhea or constipation  No heartburn or reflux  GU: No dysuria.  Chronic incontinence MUSCULOSKELETAL: No joint pain, swelling or stiffness  No new or worsening back pain  No muscle ache, pain, weakness    Gait is unsteady  No recent falls.  NEUROLOGIC: No dizziness, fainting, headache, numbness  see history of present illness PSYCHIATRIC: Chronic anxiety, worse in the last few days with spouse's acute illness    PHYSICAL EXAM Filed Vitals:   05/17/13 1523  BP: 156/75  Pulse: 66  Temp: 97.4 F (36.3 C)  Resp: 20  SpO2: 93%   There is no weight on file to calculate BMI.  GENERAL APPEARANCE:  No acute distress, appropriately groomed, Overweight body habitus. Alert, pleasant, conversant  SKIN: No diaphoresis, rash,  HEAD: Normocephalic, atraumatic EYES: Conjunctiva/lids clear. Pupils round, reactive.Marland Kitchen  EARS: Poor Hearing  NOSE: No deformity or discharge. MOUTH/THROAT: Lips w/o lesions. Oral mucosa, tongue moist, w/o lesion. Oropharynx w/o redness or lesions.  NECK: Supple, full  ROM. No thyroid tenderness, enlargement or nodule LYMPHATICS: No head, neck or supraclavicular adenopathy RESPIRATORY: Breathing is even, unlabored. Lungs with few rhonchi on the right, otherwise clear  CARDIOVASCULAR: Heart RRR. No murmur or extra heart sounds  EDEMA: No peripheral edema.  GASTROINTESTINAL: Abdomen is  Obese, soft, non-tender, not distended w/ normal bowel sounds.   MUSCULOSKELETAL: Transferring and ambulating independently with her walker NEUROLOGIC: Oriented to time, place, person. Speech clear, no tremor.  PSYCHIATRIC: Mood and affect appropriate to situation  ASSESSMENT/PLAN  UTI (urinary tract infection) Ureters infection likely cause of most of the patient's symptoms last week. She is overall much improved. Escherichia coli and the specimen was resistant to Cipro levofloxacin. She is tolerating ampicillin 500 mg 3 times a day. This will continue for a full seven-day course. Patient's been encouraged to increase fluid intake and resume her usual activity level as she tolerates  Acute bronchitis Evidence of bronchitis on chest x-ray, the patient also continues with some abnormal lung sounds and intermittent coughing. Have instructed her on some basic breathing exercises to 8 respiratory status. Also encouraged to continue adequate fluid intake and increase activity she tolerates.    Family/ staff Communication:    Labs/tests ordered:   Follow up:  Return in about 4 days (around 05/21/2013) for OV scheduled with Dr. Nyoka Cowden.   Mardene Celeste, NP-C [facility] [phone]  05/17/2013

## 2013-05-18 DIAGNOSIS — J209 Acute bronchitis, unspecified: Secondary | ICD-10-CM | POA: Insufficient documentation

## 2013-05-18 DIAGNOSIS — N390 Urinary tract infection, site not specified: Secondary | ICD-10-CM | POA: Insufficient documentation

## 2013-05-18 DIAGNOSIS — J189 Pneumonia, unspecified organism: Secondary | ICD-10-CM | POA: Diagnosis not present

## 2013-05-18 DIAGNOSIS — M6281 Muscle weakness (generalized): Secondary | ICD-10-CM | POA: Diagnosis not present

## 2013-05-18 NOTE — Assessment & Plan Note (Signed)
Evidence of bronchitis on chest x-ray, the patient also continues with some abnormal lung sounds and intermittent coughing. Have instructed her on some basic breathing exercises to 8 respiratory status. Also encouraged to continue adequate fluid intake and increase activity she tolerates.

## 2013-05-18 NOTE — Assessment & Plan Note (Signed)
Ureters infection likely cause of most of the patient's symptoms last week. She is overall much improved. Escherichia coli and the specimen was resistant to Cipro levofloxacin. She is tolerating ampicillin 500 mg 3 times a day. This will continue for a full seven-day course. Patient's been encouraged to increase fluid intake and resume her usual activity level as she tolerates

## 2013-05-21 ENCOUNTER — Encounter: Payer: Self-pay | Admitting: Internal Medicine

## 2013-05-21 ENCOUNTER — Non-Acute Institutional Stay: Payer: Medicare Other | Admitting: Internal Medicine

## 2013-05-21 VITALS — BP 108/62 | HR 80 | Temp 96.7°F | Ht <= 58 in | Wt 148.0 lb

## 2013-05-21 DIAGNOSIS — R062 Wheezing: Secondary | ICD-10-CM

## 2013-05-21 DIAGNOSIS — I10 Essential (primary) hypertension: Secondary | ICD-10-CM | POA: Diagnosis not present

## 2013-05-21 DIAGNOSIS — J209 Acute bronchitis, unspecified: Secondary | ICD-10-CM | POA: Diagnosis not present

## 2013-05-21 DIAGNOSIS — R4182 Altered mental status, unspecified: Secondary | ICD-10-CM

## 2013-05-21 DIAGNOSIS — M4800 Spinal stenosis, site unspecified: Secondary | ICD-10-CM

## 2013-05-21 DIAGNOSIS — F411 Generalized anxiety disorder: Secondary | ICD-10-CM | POA: Diagnosis not present

## 2013-05-21 MED ORDER — METHADONE HCL 5 MG PO TABS: 5.0000 mg | ORAL_TABLET | ORAL | Status: DC

## 2013-05-21 NOTE — Progress Notes (Signed)
Patient ID: Kelly Irwin, female   DOB: 1927/06/24, 80 y.o.   MRN: [id number]    Location:  [facility] (12)    Allergies  Allergen Reactions  . Cozaar [Losartan Potassium]   . Doxycycline   . Lamictal [Lamotrigine]   . Neurontin [Gabapentin]   . Nsaids     Chief Complaint  Patient presents with  . Medical Managment of Chronic Issues    blood pressure, anxiety  . Bronchitis    UTI, disorientation, was in rehab las week     HPI:  Acute bronchitis: improved. Back in her apt. After spending a few days in Rehab.  Unspecified essential hypertension:controlled  Spinal stenosis, unspecified region other than cervical: doing well in regards to pain control. Daughter would like to try tapering the pain med to a lower total dosage, especially since she gets drowsy in the day.  Altered mental status; resolved.  Wheeze: improved. Albuterol inducesd tremors.    Medications: Patient's Medications  New Prescriptions   No medications on file  Previous Medications   AMMONIUM LACTATE (LAC-HYDRIN) 12 % LOTION    Apply topically. Apply to lower leg as needed for rash   ARIPIPRAZOLE (ABILIFY) 2 MG TABLET    Take 2 mg by mouth daily. Take 1 tablet at bedtime for depression   BUSPIRONE (BUSPAR) 5 MG TABLET    Take 5 mg by mouth. Take one tablet in morning, may have extra 5mg  as needed during the day   CALCIUM CARBONATE-VITAMIN D (CALCIUM 600+D3) 600-400 MG-UNIT PER TABLET    Take 1 tablet by mouth daily. Take one tablet  daily   ESTRADIOL (ESTRACE) 0.5 MG TABLET    Take one tablet Monday and Thursday for hormone supplement   IPRATROPIUM-ALBUTEROL (DUONEB) 0.5-2.5 (3) MG/3ML SOLN    Take 3 mLs by nebulization every 6 (six) hours.   LEVOTHYROXINE (SYNTHROID, LEVOTHROID) 100 MCG TABLET    Take one tablet once a day for thyroid supplement   LORAZEPAM (ATIVAN) 0.5 MG TABLET    Take 0.5 mg by mouth. Take one tablet at bedtime   MELATONIN 3 MG  CAPS    Take by mouth. Take one capsule daily   METOPROLOL SUCCINATE (TOPROL-XL) 25 MG 24 HR TABLET    Take one tablet every day for blood pressure   MULTIPLE VITAMINS-MINERALS (CARRAVITE PO)    Take by mouth. Take one tablet daily   NYSTATIN-TRIAMCINOLONE OINTMENT (MYCOLOG)    Apply topically. Apply daily to inflammatory folds under breast once a morning   OMEPRAZOLE (PRILOSEC) 20 MG CAPSULE    Take one capsule twice daily to reduce stomach acid   POLYETHYLENE GLYCOL (MIRALAX / GLYCOLAX) PACKET    Take 17 g by mouth daily. For constipation. Hold for loose stool   SERTRALINE (ZOLOFT) 50 MG TABLET    Take 50 mg by mouth. Take 1 1/2 tablet (75mg ) each morning for depression   TALC (ZEASORB) POWDER    Apply topically. Apply twice daily to areas under breast and on feet  Modified Medications   Modified Medication Previous Medication   METHADONE (DOLOPHINE) 5 MG TABLET methadone (DOLOPHINE) 5 MG tablet      Take 5 mg by mouth as directed. Take 1 1/2 tablet (7.5mg ) three times daily  to treat chronic pain    Take 1 tablet (5 mg total) by mouth as directed. Take 2 tableta at 9am and 1 1/2 tablet at 1pm and 2 tablets at 9pm to  treat chronic pain  Discontinued Medications   No medications on file     Review of Systems  Constitutional: Positive for activity change, appetite change and fatigue. Negative for fever and diaphoresis.  HENT: Positive for hearing loss. Negative for congestion.   Eyes: Negative.   Respiratory: Positive for cough and shortness of breath.   Cardiovascular: Negative for chest pain, palpitations and leg swelling.  Gastrointestinal: Negative.   Endocrine: Negative.   Genitourinary:       Incontinent  Musculoskeletal: Positive for back pain and gait problem.  Skin: Negative.   Allergic/Immunologic: Negative.   Neurological: Positive for tremors (worse on albuteerol). Negative for dizziness and seizures.  Hematological: Negative.   Psychiatric/Behavioral: Negative.      Filed Vitals:   05/21/13 1407  BP: 108/62  Pulse: 80  Temp: 96.7 F (35.9 C)  TempSrc: Oral  Height: 4\' 9"  (1.448 m)  Weight: 148 lb (67.132 kg)   Physical Exam  Constitutional:  Overweight. Frail. Elderly.  HENT:  Right Ear: External ear normal.  Left Ear: External ear normal.  hearing diminished  Neck: No JVD present. No tracheal deviation present. No thyromegaly present.  Cardiovascular: Normal rate, regular rhythm, normal heart sounds and intact distal pulses.  Exam reveals no gallop and no friction rub.   No murmur heard. Pulmonary/Chest: No respiratory distress. She has wheezes. She has rales. She exhibits no tenderness.  Abdominal: She exhibits no distension and no mass. There is no tenderness.  Musculoskeletal: Normal range of motion. She exhibits no edema and no tenderness.  Using rolling walker  Lymphadenopathy:    She has no cervical adenopathy.  Neurological: No cranial nerve deficit. She exhibits normal muscle tone. Coordination normal.  Skin: No rash noted. No erythema. No pallor.  Psychiatric: She has a normal mood and affect. Her behavior is normal. Thought content normal.     Labs reviewed: Nursing Home on 05/17/2013  Component Date Value Ref Range Status  . Hemoglobin 05/11/2013 12.8  12.0 - 16.0 g/dL Final  . HCT 05/11/2013 37  36 - 46 % Final  . Platelets 05/11/2013 133* 150 - 399 K/L Final  . WBC 05/11/2013 4.6   Final  . Glucose 05/11/2013 106   Final  . BUN 05/11/2013 13  4 - 21 mg/dL Final  . Creatinine 05/11/2013 0.8  0.5 - 1.1 mg/dL Final  . Potassium 05/11/2013 4.2  3.4 - 5.3 mmol/L Final  . Sodium 05/11/2013 133* 137 - 147 mmol/L Final  Nursing Home on 05/11/2013  Component Date Value Ref Range Status  . Hemoglobin 05/10/2013 13.4  12.0 - 16.0 g/dL Final  . HCT 05/10/2013 39  36 - 46 % Final  . Platelets 05/10/2013 126* 150 - 399 K/L Final  . WBC 05/10/2013 5.7   Final  . Glucose 05/10/2013 123   Final  . BUN 05/10/2013 15  4 - 21  mg/dL Final  . Creatinine 05/10/2013 0.9  0.5 - 1.1 mg/dL Final  . Potassium 05/10/2013 6.4* 3.4 - 5.3 mmol/L Final  . Sodium 05/10/2013 132* 137 - 147 mmol/L Final      Assessment/Plan  1. Acute bronchitis imroved  2. Unspecified essential hypertension controlled  3. Spinal stenosis, unspecified region other than cervical Reduce pain med - methadone (DOLOPHINE) 5 MG tablet; Take 1 tablet (5 mg total) by mouth as directed. Take 1 1/2 tablet (7.5mg ) three times daily  to treat chronic pain  Dispense: 150 tablet; Refill: 0  4. Altered mental status resolved  5. Wheeze improved

## 2013-05-25 DIAGNOSIS — M6281 Muscle weakness (generalized): Secondary | ICD-10-CM | POA: Diagnosis not present

## 2013-05-25 DIAGNOSIS — M545 Low back pain, unspecified: Secondary | ICD-10-CM | POA: Diagnosis not present

## 2013-05-25 DIAGNOSIS — M546 Pain in thoracic spine: Secondary | ICD-10-CM | POA: Diagnosis not present

## 2013-05-25 DIAGNOSIS — J189 Pneumonia, unspecified organism: Secondary | ICD-10-CM | POA: Diagnosis not present

## 2013-05-31 DIAGNOSIS — M6281 Muscle weakness (generalized): Secondary | ICD-10-CM | POA: Diagnosis not present

## 2013-05-31 DIAGNOSIS — J189 Pneumonia, unspecified organism: Secondary | ICD-10-CM | POA: Diagnosis not present

## 2013-06-05 ENCOUNTER — Other Ambulatory Visit: Payer: Self-pay | Admitting: *Deleted

## 2013-06-05 DIAGNOSIS — F331 Major depressive disorder, recurrent, moderate: Secondary | ICD-10-CM | POA: Diagnosis not present

## 2013-06-05 MED ORDER — OMEPRAZOLE 20 MG PO CPDR: DELAYED_RELEASE_CAPSULE | ORAL | Status: DC

## 2013-06-05 NOTE — Telephone Encounter (Signed)
Kelly Irwin 

## 2013-06-06 DIAGNOSIS — J189 Pneumonia, unspecified organism: Secondary | ICD-10-CM | POA: Diagnosis not present

## 2013-06-06 DIAGNOSIS — M6281 Muscle weakness (generalized): Secondary | ICD-10-CM | POA: Diagnosis not present

## 2013-06-11 ENCOUNTER — Other Ambulatory Visit: Payer: Self-pay | Admitting: Geriatric Medicine

## 2013-06-11 DIAGNOSIS — M4800 Spinal stenosis, site unspecified: Secondary | ICD-10-CM

## 2013-06-11 MED ORDER — METHADONE HCL 5 MG PO TABS: 5.0000 mg | ORAL_TABLET | ORAL | Status: DC

## 2013-06-12 DIAGNOSIS — F331 Major depressive disorder, recurrent, moderate: Secondary | ICD-10-CM | POA: Diagnosis not present

## 2013-06-13 DIAGNOSIS — J189 Pneumonia, unspecified organism: Secondary | ICD-10-CM | POA: Diagnosis not present

## 2013-06-13 DIAGNOSIS — M6281 Muscle weakness (generalized): Secondary | ICD-10-CM | POA: Diagnosis not present

## 2013-06-15 DIAGNOSIS — J189 Pneumonia, unspecified organism: Secondary | ICD-10-CM | POA: Diagnosis not present

## 2013-06-15 DIAGNOSIS — M6281 Muscle weakness (generalized): Secondary | ICD-10-CM | POA: Diagnosis not present

## 2013-06-18 DIAGNOSIS — J189 Pneumonia, unspecified organism: Secondary | ICD-10-CM | POA: Diagnosis not present

## 2013-06-18 DIAGNOSIS — M6281 Muscle weakness (generalized): Secondary | ICD-10-CM | POA: Diagnosis not present

## 2013-06-19 ENCOUNTER — Encounter: Payer: Self-pay | Admitting: Internal Medicine

## 2013-06-19 DIAGNOSIS — F331 Major depressive disorder, recurrent, moderate: Secondary | ICD-10-CM | POA: Diagnosis not present

## 2013-06-20 DIAGNOSIS — M6281 Muscle weakness (generalized): Secondary | ICD-10-CM | POA: Diagnosis not present

## 2013-06-20 DIAGNOSIS — J189 Pneumonia, unspecified organism: Secondary | ICD-10-CM | POA: Diagnosis not present

## 2013-06-22 DIAGNOSIS — J189 Pneumonia, unspecified organism: Secondary | ICD-10-CM | POA: Diagnosis not present

## 2013-06-22 DIAGNOSIS — M6281 Muscle weakness (generalized): Secondary | ICD-10-CM | POA: Diagnosis not present

## 2013-06-25 DIAGNOSIS — J189 Pneumonia, unspecified organism: Secondary | ICD-10-CM | POA: Diagnosis not present

## 2013-06-25 DIAGNOSIS — M6281 Muscle weakness (generalized): Secondary | ICD-10-CM | POA: Diagnosis not present

## 2013-06-26 DIAGNOSIS — M6281 Muscle weakness (generalized): Secondary | ICD-10-CM | POA: Diagnosis not present

## 2013-06-26 DIAGNOSIS — F331 Major depressive disorder, recurrent, moderate: Secondary | ICD-10-CM | POA: Diagnosis not present

## 2013-06-26 DIAGNOSIS — J189 Pneumonia, unspecified organism: Secondary | ICD-10-CM | POA: Diagnosis not present

## 2013-06-29 DIAGNOSIS — J189 Pneumonia, unspecified organism: Secondary | ICD-10-CM | POA: Diagnosis not present

## 2013-06-29 DIAGNOSIS — M6281 Muscle weakness (generalized): Secondary | ICD-10-CM | POA: Diagnosis not present

## 2013-06-29 DIAGNOSIS — Z0289 Encounter for other administrative examinations: Secondary | ICD-10-CM

## 2013-07-02 DIAGNOSIS — M6281 Muscle weakness (generalized): Secondary | ICD-10-CM | POA: Diagnosis not present

## 2013-07-02 DIAGNOSIS — J189 Pneumonia, unspecified organism: Secondary | ICD-10-CM | POA: Diagnosis not present

## 2013-07-03 DIAGNOSIS — F331 Major depressive disorder, recurrent, moderate: Secondary | ICD-10-CM | POA: Diagnosis not present

## 2013-07-04 DIAGNOSIS — J189 Pneumonia, unspecified organism: Secondary | ICD-10-CM | POA: Diagnosis not present

## 2013-07-04 DIAGNOSIS — M6281 Muscle weakness (generalized): Secondary | ICD-10-CM | POA: Diagnosis not present

## 2013-07-06 ENCOUNTER — Other Ambulatory Visit: Payer: Self-pay | Admitting: *Deleted

## 2013-07-06 MED ORDER — ESTRADIOL 0.5 MG PO TABS: ORAL_TABLET | ORAL | Status: DC

## 2013-07-06 NOTE — Telephone Encounter (Signed)
[facility] 

## 2013-07-09 ENCOUNTER — Other Ambulatory Visit: Payer: Self-pay | Admitting: Geriatric Medicine

## 2013-07-09 DIAGNOSIS — M4800 Spinal stenosis, site unspecified: Secondary | ICD-10-CM

## 2013-07-09 MED ORDER — METHADONE HCL 5 MG PO TABS: 5.0000 mg | ORAL_TABLET | ORAL | Status: DC

## 2013-07-10 DIAGNOSIS — F331 Major depressive disorder, recurrent, moderate: Secondary | ICD-10-CM | POA: Diagnosis not present

## 2013-07-11 DIAGNOSIS — J189 Pneumonia, unspecified organism: Secondary | ICD-10-CM | POA: Diagnosis not present

## 2013-07-11 DIAGNOSIS — M6281 Muscle weakness (generalized): Secondary | ICD-10-CM | POA: Diagnosis not present

## 2013-07-13 DIAGNOSIS — M6281 Muscle weakness (generalized): Secondary | ICD-10-CM | POA: Diagnosis not present

## 2013-07-13 DIAGNOSIS — J189 Pneumonia, unspecified organism: Secondary | ICD-10-CM | POA: Diagnosis not present

## 2013-07-17 DIAGNOSIS — R21 Rash and other nonspecific skin eruption: Secondary | ICD-10-CM | POA: Diagnosis not present

## 2013-07-17 DIAGNOSIS — F331 Major depressive disorder, recurrent, moderate: Secondary | ICD-10-CM | POA: Diagnosis not present

## 2013-07-31 DIAGNOSIS — F331 Major depressive disorder, recurrent, moderate: Secondary | ICD-10-CM | POA: Diagnosis not present

## 2013-08-01 DIAGNOSIS — M48061 Spinal stenosis, lumbar region without neurogenic claudication: Secondary | ICD-10-CM | POA: Diagnosis not present

## 2013-08-01 DIAGNOSIS — M6281 Muscle weakness (generalized): Secondary | ICD-10-CM | POA: Diagnosis not present

## 2013-08-01 DIAGNOSIS — R279 Unspecified lack of coordination: Secondary | ICD-10-CM | POA: Diagnosis not present

## 2013-08-06 DIAGNOSIS — R279 Unspecified lack of coordination: Secondary | ICD-10-CM | POA: Diagnosis not present

## 2013-08-06 DIAGNOSIS — M6281 Muscle weakness (generalized): Secondary | ICD-10-CM | POA: Diagnosis not present

## 2013-08-06 DIAGNOSIS — M48061 Spinal stenosis, lumbar region without neurogenic claudication: Secondary | ICD-10-CM | POA: Diagnosis not present

## 2013-08-07 ENCOUNTER — Other Ambulatory Visit: Payer: Self-pay | Admitting: Geriatric Medicine

## 2013-08-07 DIAGNOSIS — M4800 Spinal stenosis, site unspecified: Secondary | ICD-10-CM

## 2013-08-07 MED ORDER — METHADONE HCL 5 MG PO TABS: 5.0000 mg | ORAL_TABLET | ORAL | Status: DC

## 2013-08-08 ENCOUNTER — Other Ambulatory Visit: Payer: Self-pay | Admitting: Internal Medicine

## 2013-08-08 DIAGNOSIS — M48061 Spinal stenosis, lumbar region without neurogenic claudication: Secondary | ICD-10-CM | POA: Diagnosis not present

## 2013-08-08 DIAGNOSIS — M6281 Muscle weakness (generalized): Secondary | ICD-10-CM | POA: Diagnosis not present

## 2013-08-08 DIAGNOSIS — R279 Unspecified lack of coordination: Secondary | ICD-10-CM | POA: Diagnosis not present

## 2013-08-10 DIAGNOSIS — M48061 Spinal stenosis, lumbar region without neurogenic claudication: Secondary | ICD-10-CM | POA: Diagnosis not present

## 2013-08-10 DIAGNOSIS — R279 Unspecified lack of coordination: Secondary | ICD-10-CM | POA: Diagnosis not present

## 2013-08-10 DIAGNOSIS — M6281 Muscle weakness (generalized): Secondary | ICD-10-CM | POA: Diagnosis not present

## 2013-08-14 DIAGNOSIS — M48061 Spinal stenosis, lumbar region without neurogenic claudication: Secondary | ICD-10-CM | POA: Diagnosis not present

## 2013-08-14 DIAGNOSIS — R279 Unspecified lack of coordination: Secondary | ICD-10-CM | POA: Diagnosis not present

## 2013-08-14 DIAGNOSIS — M6281 Muscle weakness (generalized): Secondary | ICD-10-CM | POA: Diagnosis not present

## 2013-08-15 ENCOUNTER — Non-Acute Institutional Stay: Payer: Medicare Other | Admitting: Geriatric Medicine

## 2013-08-15 ENCOUNTER — Encounter: Payer: Self-pay | Admitting: Geriatric Medicine

## 2013-08-15 VITALS — BP 118/60 | HR 68 | Wt 148.0 lb

## 2013-08-15 DIAGNOSIS — I10 Essential (primary) hypertension: Secondary | ICD-10-CM

## 2013-08-15 DIAGNOSIS — M4800 Spinal stenosis, site unspecified: Secondary | ICD-10-CM | POA: Diagnosis not present

## 2013-08-15 DIAGNOSIS — F411 Generalized anxiety disorder: Secondary | ICD-10-CM

## 2013-08-15 DIAGNOSIS — K5900 Constipation, unspecified: Secondary | ICD-10-CM

## 2013-08-15 DIAGNOSIS — J209 Acute bronchitis, unspecified: Secondary | ICD-10-CM

## 2013-08-15 DIAGNOSIS — R4189 Other symptoms and signs involving cognitive functions and awareness: Secondary | ICD-10-CM

## 2013-08-15 DIAGNOSIS — M6281 Muscle weakness (generalized): Secondary | ICD-10-CM | POA: Diagnosis not present

## 2013-08-15 DIAGNOSIS — N390 Urinary tract infection, site not specified: Secondary | ICD-10-CM

## 2013-08-15 DIAGNOSIS — F09 Unspecified mental disorder due to known physiological condition: Secondary | ICD-10-CM

## 2013-08-15 DIAGNOSIS — M48061 Spinal stenosis, lumbar region without neurogenic claudication: Secondary | ICD-10-CM | POA: Diagnosis not present

## 2013-08-15 DIAGNOSIS — R279 Unspecified lack of coordination: Secondary | ICD-10-CM | POA: Diagnosis not present

## 2013-08-15 NOTE — Progress Notes (Signed)
Patient ID: Kelly Irwin, female   DOB: 04-24-1927, 80 y.o.   MRN: [id number]     [facility] [phone])  Code Status: DNR   Contact Information   Name Relation Home Work [place] Spouse [phone]     Zapata,[name] Daughter [phone]         Chief Complaint  Patient presents with  . Medical Management of Chronic Issues    blood pressure, anxiety, memory    HPI: This is a 80 y.o. female resident of [facility], Assisted Living section  evaluated today for management of ongoing medical issues.    Last visit: UTI (urinary tract infection) Ureters infection likely cause of most of the patient's symptoms last week. She is overall much improved. Escherichia coli and the specimen was resistant to Cipro levofloxacin. She is tolerating ampicillin 500 mg 3 times a day. This will continue for a full seven-day course. Patient's been encouraged to increase fluid intake and resume her usual activity level as she tolerates Acute bronchitis Evidence of bronchitis on chest x-ray, the patient also continues with some abnormal lung sounds and intermittent coughing. Have instructed her on some basic breathing exercises to 8 respiratory status. Also encouraged to continue adequate fluid intake and increase activity she tolerates.  Since last visit, both UTI and acute bronchitis resolved without complication. Vital signs including weight have been stable. Patient continues regular dosing of methadone for chronic back pain with adequate relief.  Anxiety continues to be an issue that impacts her daily functioning. The patient had an appointment with her therapist yesterday that she did not attend. Patient reports back pain is very well controlled with current medication. Reviewed results of Pharmacogenetic testing with patient and her daughter, Dr. Dillard Essex. Sertraline is likely not providing benefit to this patient due to genetic  variation.   Allergies  Allergen Reactions  . Cozaar [Losartan Potassium]   . Doxycycline   . Lamictal [Lamotrigine]   . Neurontin [Gabapentin]   . Nsaids     MEDICATIONS -  Reviewed  DATA REVIEWED  Radiologic Exams  [facility] X-ray 05/10/12 CXR:  No cardiomegaly,pulmonary vascular congestion or effusion. No inflammatory consolidate or suspicious nodule 05/12/2012 CXR: Evidence of bilateral bronchitis. Mild airspace opacity bilateral lower lobes left greater than right  Laboratory Studies: Lab Results  Component Value Date   WBC 4.6 05/11/2013   HGB 12.8 05/11/2013   HCT 37 05/11/2013   PLT 133* 05/11/2013   Lab Results  Component Value Date   NA 133* 05/11/2013   K 4.2 05/11/2013   GLU 106 05/11/2013   BUN 13 05/11/2013   CREATININE 0.8 05/11/2013   05/10/1013: U/A: Clear, negtive nitrite, leuk.esterase, no bacteria  Culture returned 05/14/13: >100,000 E.coli, resistant to cipro/levofloxacin   REVIEW OF SYSTEMS  DATA OBTAINED: from patient, nurse GENERAL:  Feels well   No recent fever, fatigue, change in appetite or weight SKIN: No itch, rash or open wounds NOSE: No congestion, drainage or bleeding MOUTH/THROAT: No mouth or tooth pain    No sore throat   No difficulty chewing or swallowing RESPIRATORY: No cough, wheeze, SOB CARDIAC: No chest pain, palpitations  No edema. GI: No abdominal pain   No nausea, vomiting,diarrhea or constipation  No heartburn or reflux  GU: No dysuria.  Chronic incontinence, has not been taking Miralax consistently, last BM 2 days ago MUSCULOSKELETAL: No joint pain, swelling or stiffness  No new or worsening back pain  No muscle ache, pain, weakness  Gait is unsteady  No recent falls.  NEUROLOGIC: No dizziness, fainting, headache, numbness. STML evident per daughter's report PSYCHIATRIC: Chronic anxiety, nursing staff notes some resistance with bathing assistance   PHYSICAL EXAM Filed Vitals:   08/15/13 1621  BP: 118/60  Pulse: 68   Weight: 148 lb (67.132 kg)   Body mass index is 32.02 kg/(m^2).  GENERAL APPEARANCE: No acute distress, appropriately groomed, Overweight body habitus. Alert, pleasant, conversant  SKIN: No diaphoresis, rash,  HEAD: Normocephalic, atraumatic EYES: Conjunctiva/lids clear. Pupils round, reactive.Marland Kitchen  EARS: Poor Hearing  NOSE: No deformity or discharge. MOUTH/THROAT: Lips w/o lesions. Oral mucosa, tongue moist, w/o lesion. Oropharynx w/o redness or lesions.  NECK: Supple, full ROM. No thyroid tenderness, enlargement or nodule LYMPHATICS: No head, neck or supraclavicular adenopathy RESPIRATORY: Breathing is even, unlabored. Lung sounds are clear and full CARDIOVASCULAR: Heart RRR. No murmur or extra heart sounds  EDEMA: No peripheral edema.  GASTROINTESTINAL: Abdomen is  Obese, soft, non-tender, not distended w/ normal bowel sounds.   MUSCULOSKELETAL: Transferring and ambulating independently with her walker NEUROLOGIC: Oriented to time, place, person. Speech clear, no tremor.  PSYCHIATRIC: Mood and affect appropriate to situation  ASSESSMENT/PLAN  Unspecified essential hypertension Blood pressure remains well controlled, continue current medication, update lab at intervals  Acute bronchitis Resolved  UTI (urinary tract infection) Resolved  Spinal stenosis, unspecified region other than cervical Patient's chronic back pain is well-controlled with current dose of methadone. She is not exhibiting any adverse effects from the medication.   Anxiety state, unspecified Long-standing problem with anxiety continue to impact this patient's daily functional cognitive status. She is acutely aware of her deficits, becomes weepy when these are addressed. Primary caregiving staff providing assistance with morning care has been very helpful.  Patient continues with multiple medications to treat anxiety and depression. Pharmacogenetic testing has identified sertraline as a medication not likely to bet  providing benefit. Will decrease this medication. Patient is due to followup with Dr. Caprice Beaver in 2 weeks.  Cognitive decline Unable to assess patient's cognitive status and she becomes very anxious and tearful with any questioning of her memory. Daughter reports patient's short-term memory is much worse, patient's spouse assists her significantly. Daughter will be providing increased assistance with managing appointments.  Unspecified constipation Patient has not been taking MiraLax consistently, short-term memory problem is impacting her ability to manage this problem herself. Will ask nursing staff to remind patient to take MiraLax daily    Family/ staff Communication:  Daughter, Selinda Eon presents for entire visit today  Labs/tests ordered:   Follow up:  Return in about 3 months (around 11/15/2013) for Annual Update.   Mardene Celeste, NP-C [place] [phone]  08/15/2013

## 2013-08-17 ENCOUNTER — Encounter: Payer: Self-pay | Admitting: Geriatric Medicine

## 2013-08-17 DIAGNOSIS — R279 Unspecified lack of coordination: Secondary | ICD-10-CM | POA: Diagnosis not present

## 2013-08-17 DIAGNOSIS — M48061 Spinal stenosis, lumbar region without neurogenic claudication: Secondary | ICD-10-CM | POA: Diagnosis not present

## 2013-08-17 DIAGNOSIS — M6281 Muscle weakness (generalized): Secondary | ICD-10-CM | POA: Diagnosis not present

## 2013-08-19 NOTE — Assessment & Plan Note (Signed)
Long-standing problem with anxiety continue to impact this patient's daily functional cognitive status. She is acutely aware of her deficits, becomes weepy when these are addressed. Primary caregiving staff providing assistance with morning care has been very helpful.  Patient continues with multiple medications to treat anxiety and depression. Pharmacogenetic testing has identified sertraline as a medication not likely to bet providing benefit. Will decrease this medication. Patient is due to followup with Dr. Caprice Beaver in 2 weeks.

## 2013-08-19 NOTE — Assessment & Plan Note (Signed)
Patient's chronic back pain is well-controlled with current dose of methadone. She is not exhibiting any adverse effects from the medication.

## 2013-08-19 NOTE — Assessment & Plan Note (Signed)
Patient has not been taking MiraLax consistently, short-term memory problem is impacting her ability to manage this problem herself. Will ask nursing staff to remind patient to take MiraLax daily

## 2013-08-19 NOTE — Assessment & Plan Note (Signed)
Unable to assess patient's cognitive status and she becomes very anxious and tearful with any questioning of her memory. Daughter reports patient's short-term memory is much worse, patient's spouse assists her significantly. Daughter will be providing increased assistance with managing appointments.

## 2013-08-19 NOTE — Assessment & Plan Note (Signed)
Resolved

## 2013-08-19 NOTE — Assessment & Plan Note (Signed)
Blood pressure remains well controlled, continue current medication, update lab at intervals

## 2013-08-20 DIAGNOSIS — M6281 Muscle weakness (generalized): Secondary | ICD-10-CM | POA: Diagnosis not present

## 2013-08-20 DIAGNOSIS — R279 Unspecified lack of coordination: Secondary | ICD-10-CM | POA: Diagnosis not present

## 2013-08-20 DIAGNOSIS — M48061 Spinal stenosis, lumbar region without neurogenic claudication: Secondary | ICD-10-CM | POA: Diagnosis not present

## 2013-08-24 DIAGNOSIS — M6281 Muscle weakness (generalized): Secondary | ICD-10-CM | POA: Diagnosis not present

## 2013-08-24 DIAGNOSIS — M48061 Spinal stenosis, lumbar region without neurogenic claudication: Secondary | ICD-10-CM | POA: Diagnosis not present

## 2013-08-24 DIAGNOSIS — R279 Unspecified lack of coordination: Secondary | ICD-10-CM | POA: Diagnosis not present

## 2013-08-27 ENCOUNTER — Other Ambulatory Visit: Payer: Self-pay | Admitting: *Deleted

## 2013-08-27 MED ORDER — METOPROLOL SUCCINATE ER 25 MG PO TB24: ORAL_TABLET | ORAL | Status: DC

## 2013-08-27 NOTE — Telephone Encounter (Signed)
[facility] 

## 2013-08-28 DIAGNOSIS — F331 Major depressive disorder, recurrent, moderate: Secondary | ICD-10-CM | POA: Diagnosis not present

## 2013-08-29 DIAGNOSIS — R279 Unspecified lack of coordination: Secondary | ICD-10-CM | POA: Diagnosis not present

## 2013-08-29 DIAGNOSIS — M48061 Spinal stenosis, lumbar region without neurogenic claudication: Secondary | ICD-10-CM | POA: Diagnosis not present

## 2013-08-29 DIAGNOSIS — M6281 Muscle weakness (generalized): Secondary | ICD-10-CM | POA: Diagnosis not present

## 2013-08-30 DIAGNOSIS — M6281 Muscle weakness (generalized): Secondary | ICD-10-CM | POA: Diagnosis not present

## 2013-08-30 DIAGNOSIS — R279 Unspecified lack of coordination: Secondary | ICD-10-CM | POA: Diagnosis not present

## 2013-08-30 DIAGNOSIS — M48061 Spinal stenosis, lumbar region without neurogenic claudication: Secondary | ICD-10-CM | POA: Diagnosis not present

## 2013-08-31 DIAGNOSIS — M6281 Muscle weakness (generalized): Secondary | ICD-10-CM | POA: Diagnosis not present

## 2013-08-31 DIAGNOSIS — M48061 Spinal stenosis, lumbar region without neurogenic claudication: Secondary | ICD-10-CM | POA: Diagnosis not present

## 2013-08-31 DIAGNOSIS — R279 Unspecified lack of coordination: Secondary | ICD-10-CM | POA: Diagnosis not present

## 2013-09-03 DIAGNOSIS — R279 Unspecified lack of coordination: Secondary | ICD-10-CM | POA: Diagnosis not present

## 2013-09-03 DIAGNOSIS — M48061 Spinal stenosis, lumbar region without neurogenic claudication: Secondary | ICD-10-CM | POA: Diagnosis not present

## 2013-09-03 DIAGNOSIS — M6281 Muscle weakness (generalized): Secondary | ICD-10-CM | POA: Diagnosis not present

## 2013-09-05 DIAGNOSIS — M6281 Muscle weakness (generalized): Secondary | ICD-10-CM | POA: Diagnosis not present

## 2013-09-05 DIAGNOSIS — M48061 Spinal stenosis, lumbar region without neurogenic claudication: Secondary | ICD-10-CM | POA: Diagnosis not present

## 2013-09-05 DIAGNOSIS — R279 Unspecified lack of coordination: Secondary | ICD-10-CM | POA: Diagnosis not present

## 2013-09-10 ENCOUNTER — Other Ambulatory Visit: Payer: Self-pay | Admitting: *Deleted

## 2013-09-10 MED ORDER — POLYETHYLENE GLYCOL 3350 17 G PO PACK: 17.0000 g | PACK | Freq: Every day | ORAL | Status: DC

## 2013-09-18 ENCOUNTER — Other Ambulatory Visit: Payer: Self-pay

## 2013-09-18 DIAGNOSIS — F331 Major depressive disorder, recurrent, moderate: Secondary | ICD-10-CM | POA: Diagnosis not present

## 2013-09-18 MED ORDER — LEVOTHYROXINE SODIUM 100 MCG PO TABS: ORAL_TABLET | ORAL | Status: DC

## 2013-09-18 NOTE — Telephone Encounter (Signed)
RX for levothyroxine was sent in electronically. Left message on VM for patient to return call to discuss how she is currently taking Zoloft (2 sets of instructions listed on rx)

## 2013-09-20 ENCOUNTER — Other Ambulatory Visit: Payer: Self-pay | Admitting: *Deleted

## 2013-09-20 MED ORDER — SERTRALINE HCL 50 MG PO TABS: ORAL_TABLET | ORAL | Status: DC

## 2013-09-20 NOTE — Telephone Encounter (Signed)
Kelly Irwin 

## 2013-09-27 ENCOUNTER — Other Ambulatory Visit: Payer: Self-pay | Admitting: Internal Medicine

## 2013-10-02 DIAGNOSIS — F331 Major depressive disorder, recurrent, moderate: Secondary | ICD-10-CM | POA: Diagnosis not present

## 2013-10-16 DIAGNOSIS — F331 Major depressive disorder, recurrent, moderate: Secondary | ICD-10-CM | POA: Diagnosis not present

## 2013-10-30 DIAGNOSIS — F331 Major depressive disorder, recurrent, moderate: Secondary | ICD-10-CM | POA: Diagnosis not present

## 2013-11-01 NOTE — Telephone Encounter (Signed)
RX was filled on 09/20/13

## 2013-11-14 DIAGNOSIS — F331 Major depressive disorder, recurrent, moderate: Secondary | ICD-10-CM | POA: Diagnosis not present

## 2013-11-20 DIAGNOSIS — F331 Major depressive disorder, recurrent, moderate: Secondary | ICD-10-CM | POA: Diagnosis not present

## 2013-11-23 ENCOUNTER — Other Ambulatory Visit: Payer: Self-pay | Admitting: Internal Medicine

## 2013-11-27 DIAGNOSIS — F331 Major depressive disorder, recurrent, moderate: Secondary | ICD-10-CM | POA: Diagnosis not present

## 2013-11-28 ENCOUNTER — Non-Acute Institutional Stay: Payer: Medicare Other | Admitting: Nurse Practitioner

## 2013-11-28 ENCOUNTER — Encounter: Payer: Self-pay | Admitting: Nurse Practitioner

## 2013-11-28 VITALS — BP 120/64 | HR 64 | Temp 97.8°F | Ht <= 58 in | Wt 143.0 lb

## 2013-11-28 DIAGNOSIS — E039 Hypothyroidism, unspecified: Secondary | ICD-10-CM

## 2013-11-28 DIAGNOSIS — I10 Essential (primary) hypertension: Secondary | ICD-10-CM

## 2013-11-28 DIAGNOSIS — K2100 Gastro-esophageal reflux disease with esophagitis, without bleeding: Secondary | ICD-10-CM

## 2013-11-28 DIAGNOSIS — K5900 Constipation, unspecified: Secondary | ICD-10-CM | POA: Diagnosis not present

## 2013-11-28 DIAGNOSIS — M4800 Spinal stenosis, site unspecified: Secondary | ICD-10-CM

## 2013-11-28 DIAGNOSIS — F411 Generalized anxiety disorder: Secondary | ICD-10-CM

## 2013-11-28 NOTE — Progress Notes (Signed)
Patient ID: Kelly Irwin, female   DOB: [birth year], 80 y.o.   MRN: [id number]    [facility] [phone])  Code Status: DNR     Contact Information   Name Relation Home Work [place] Spouse [phone]     [place],[name] Daughter [phone]       Chief Complaint  Patient presents with  . Medical Management of Chronic Issues    Comprehensive exam: blood pressure, thyroid, anxiety, chronic back pain    HPI: This is a 80 y.o. female resident of [facility], Assisted Living  section. This patient  has not had a hospitalization in the last year. In February/March had URI and UTI which resulted in confusion but after treatment returned to baseline. Reports mood is based on how her husband is doing. Depending on him her mood may be better or worse. Patient continues weekly appointments with psychologist for counseling and regular followups with psychiatrist for psychotropic medication management.  Chronic back pain due to spinal stenosis is well managed with current dose of methadone. Uses walker all the time. No falls in several years.  Reports good appetite - weight is down but she has been trying to loose some weight.   Functional Status Bathing: Maximal Assist- uses whirlpool bath Bladder Management: Continent but wears pad, had some leaking, Bowel Management: Continent Feeding: Independent Hygiene and Grooming: Maximal Assist, Toileting / Clothing: moderate Assist Walk: Moderate Independence, Walker  Preventive care Due for mammogram this year Bone density- 2014 Colonoscopy- declines any colonoscopy, willing to do FOBT    Allergies  Allergen Reactions  . Cozaar [Losartan Potassium]   . Doxycycline   . Lamictal [Lamotrigine]   . Neurontin [Gabapentin]   . Nsaids    Medications    Medication List       This list is accurate as of: 11/28/13 10:28 AM.  Always use your most recent med list.               ammonium lactate 12 % lotion  Commonly known as:  LAC-HYDRIN  Apply topically. Apply to lower leg as needed for rash     ARIPiprazole 2 MG tablet  Commonly known as:  ABILIFY  Take 2 mg by mouth daily. Take 1 tablet at bedtime for depression     busPIRone 5 MG tablet  Commonly known as:  BUSPAR  Take 5 mg by mouth. Take one tablet in morning, may have extra 5mg  as needed during the day     CALCIUM 600+D3 600-400 MG-UNIT per tablet  Generic drug:  Calcium Carbonate-Vitamin D  Take 1 tablet by mouth daily. Take one tablet  daily     CARRAVITE PO  Take by mouth. Take one tablet daily     estradiol 0.5 MG tablet  Commonly known as:  ESTRACE  TAKE ONE TABLET ON MONDAY AND THURSDAY FOR HORMONE SUPPLEMENT     ipratropium-albuterol 0.5-2.5 (3) MG/3ML Soln  Commonly known as:  DUONEB  Take 3 mLs by nebulization every 6 (six) hours.     levothyroxine 100 MCG tablet  Commonly known as:  SYNTHROID, LEVOTHROID  Take one tablet once a day for thyroid supplement     LORazepam 0.5 MG tablet  Commonly known as:  ATIVAN  Take 0.5 mg by mouth. Take one tablet at bedtime     Melatonin 3 MG Caps  Take by mouth. Take one capsule daily     methadone 5 MG tablet  Commonly known as:  DOLOPHINE  Take 1 tablet (5 mg total) by mouth as directed. Take 1 1/2 tablet (7.5mg ) three times daily  to treat chronic pain     metoprolol succinate 25 MG 24 hr tablet  Commonly known as:  TOPROL-XL  Take one tablet every day for blood pressure     nystatin-triamcinolone ointment  Commonly known as:  MYCOLOG  Apply topically. Apply daily to inflammatory folds under breast once a morning     omeprazole 20 MG capsule  Commonly known as:  PRILOSEC  TAKE ONE CAPSULE TWICE A DAY TO REDUCE STOMACH ACID     polyethylene glycol packet  Commonly known as:  MIRALAX / GLYCOLAX  Take 17 g by mouth daily. For constipation. Hold for loose stool     sertraline 50 MG tablet  Commonly known as:  ZOLOFT  Take one  tablet by mouth once daily for depression     ZEASORB powder  Generic drug:  talc  Apply topically. Apply twice daily to areas under breast and on feet        DATA REVIEWED  Radiologic Exams:   [facility] List: 03/24/2010 CT Cervical spine Reversal of the normal cervical lordosis with associated 0.3 cm of anterolisthesis of C3 on C4 due to right much worse than left facet arthropathy.  Disc osteophyte complex at C5-6 results in some flattening of the ventral cord and left worse than right foraminal narrowing.  Disc bulge causes mild flattening of the ventral cord at C6-7 and mild left foraminal narrowing.            11/19/2010 CT Head 1.5 cm left frontal parietal meningioma and 6 mm right frontal meningioma. Neither exhibit significant mass effect or adjacent edema.   Cardiovascular Exams:   Mysis List:         11/10/2010 Venous Doppler No evidence of acute deep or superficial venous thrombosis noted in the bilateral femoral-popliteal venous systems, based on decreased visualization.  Unable to adequately visualize the bilateral calf veins due to edema.             Laboratory Studies:    11/11/2011 CBC: Wbc 8.4, Rbc 4.83, Hgb 13.0, Hct 38.7  CMP: Sodium 139, Potassium 5.1, glucose 93, BUN 20, Creatinine 0.86  Lipid: cholesterol 246, triglyceride 180, HDL 6.0, LDL 169  TSH 2.823   05/23/2012: Glucose 84, BUN 21, creatinine 0.4, sodium 137, potassium 4.1.  Total cholesterol 233, triglyceride 144, HDL 36, LDL 151  10/17/2012 CBC 8.5, hemoglobin 12.8, hematocrit 37.9, platelets 219  Glucose 89, BUN 15, creatinine 0.9 0.4, sodium 139, potassium 4.4. Protein/LFTs WNL.  TSH 1.075  B12 866   Past Medical History  Diagnosis Date  . Spinal stenosis, unspecified region other than cervical   . Pain in joint, shoulder region   . Unspecified essential hypertension   . Other and unspecified hyperlipidemia   . Acute upper respiratory infections of unspecified site 04/28/2012  . Edema 02/09/2012  .  Other specified erythematous condition(695.89) 01/24/2012  . Lumbago 12/27/2011  . Rash and other nonspecific skin eruption 12/27/2011  . Unspecified hypothyroidism 11/10/2011  . Anxiety state, unspecified 11/10/2011  . Depressive disorder, not elsewhere classified 11/10/2011  . Reflux esophagitis 11/10/2011  . Unspecified constipation 11/10/2011  . Osteoarthrosis, unspecified whether generalized or localized, unspecified site 11/10/2011  . Backache, unspecified 11/10/2011  . Unspecified urinary incontinence 11/10/2011  . Memory loss 12/18/2010  . Unspecified hypothyroidism   . Abnormality of gait 11/15/2012   Past Surgical History  Procedure Laterality  Date  . Back surgery      x3  . Rotator cuff repair Right   . Total abdominal hysterectomy w/ bilateral salpingoophorectomy  1972  . Cataract extraction w/ intraocular lens  implant, bilateral  2009-2010   Family History  Problem Relation Age of Onset  . Pneumonia Mother   . Alzheimer's disease Father    History   Social History Narrative   Patient is Married since 1950 Joneen Boers).  Occupation: owned Engineer, manufacturing systems (retitred). Resides in Assisted Living section at [facility] since 08/2011 . Lives with spouse.   Stopped smoking 1950s (smoked 20-3-yrs), Minimal alcohol history   Patient has  Advanced planning documents: Living Will, DNR                  Review of Systems  DATA OBTAINED: from patient GENERAL: Feels well   No fevers, fatigue, change in appetite or weight SKIN: No itch, rash or open wounds EYES: No eye pain, dryness or itching  No change in vision EARS: No earache, tinnitus, change in hearing NOSE: No congestion, drainage or bleeding MOUTH/THROAT: No mouth or tooth pain  No sore throat No difficulty chewing or swallowing RESPIRATORY: No cough, wheezing, SOB CARDIAC: No chest pain, palpitations  No edema. CHEST/BREASTS: No discomfort, discharge or lumps in breasts GI: No abdominal pain  No N/V/D  or constipation  No heartburn or reflux (takes medication) GU: No dysuria, frequency or urgency  No change in urine volume or character   MUSCULOSKELETAL: No joint pain, swelling or stiffness  No back pain  No muscle ache, pain, weakness, No recent falls.  NEUROLOGIC: No dizziness, fainting, headache   No change in mental status.  PSYCHIATRIC: Has feelings of anxiety daily. Sleeps well.  No behavior issue.    Physical Exam Filed Vitals:   11/28/13 1007  BP: 120/64  Pulse: 64  Temp: 97.8 F (36.6 C)  TempSrc: Oral  Height: 4\' 10"  (1.473 m)  Weight: 143 lb (64.864 kg)   Body mass index is 29.89 kg/(m^2).   GENERAL APPEARANCE: No acute distress, appropriately groomed, Obese body habitus. Alert, pleasant, conversant. SKIN: No diaphoresis, rash, wounds. Large flat, dry, dark keratotic lesions on chest (not new, unchanged) HEAD: Normocephalic, atraumatic EYES: Conjunctiva/lids clear. Pupils round, reactive. EOMs intact.  EARS: External exam WNL, canals clear, TM WNL. Hearing grossly normal. NOSE: No deformity or discharge. MOUTH/THROAT: Lips w/o lesions. Oral mucosa, tongue moist, w/o lesion. Oropharynx w/o redness or lesions.  NECK: Supple, full ROM.  RESPIRATORY: Breathing is even, unlabored. Lung sounds are clear CARDIOVASCULAR: Heart RRR. No murmur or extra heart sounds, DP pulse 2+.  EDEMA: No peripheral edema GASTROINTESTINAL: Abdomen is soft, non-tender, Obese w/ normal bowel sounds. No hepatic or splenic enlargement. No mass, ventral or inguinal hernia. GENITOURINARY: Bladder non tender, not distended. MUSCULOSKELETAL:Full ROM, strength and tone. Back is without kyphosis, scoliosis or spinal process tenderness. Steady with rolling walker  NEUROLOGIC: Oriented to time, place, person. Cranial nerves 2-12 grossly intact, speech clear  MMSE : 29/30, Passed Clock test (09/11/13) PSYCHIATRIC:  Mood and affect appropriate to situation, no behavioral issues    ASSESSMENT/PLAN  1.  Unspecified essential hypertension - well controlled on medication  2. Reflux esophagitis -controlled on medication  3. Unspecified constipation -stable, conts as needed miralax  4. Unspecified hypothyroidism -will follow up TSH, conts on current supplement   5. Spinal stenosis, unspecified region other than cervical Back pain management remained satisfactory with methadone. No adverse effects from medication.  6. Anxiety state, unspecified -anxiety seem fairly well controlled, conts to see psychiatrist and psychologist routinely.   PREVENTIVE COUNSELING:  The patient was counseled regarding the appropriate use of alcohol, regular self-examination of the breasts on a monthly basis, prevention of dental and periodontal disease, diet, regular sustained exercise for at least 30 minutes 3-4 times per week, routine screening interval for mammogram as recommended by the [place] and ACOG, importance of regular PAP smears, proper use of sunscreen and protective clothing, tobacco use,  and recommended schedule for GI hemoccult testing, colonoscopy, cholesterol, thyroid and diabetes screening. -will get staff to hemoccult stools x 3, pt has routine eye exam, mammogram, dental appt and podiatrist appt scheduled  -will update lab work at this time TSH, CMP and CBC   To follow up in 4 months with Dr Nyoka Cowden and before if needed

## 2013-11-29 DIAGNOSIS — R609 Edema, unspecified: Secondary | ICD-10-CM | POA: Diagnosis not present

## 2013-11-29 DIAGNOSIS — I10 Essential (primary) hypertension: Secondary | ICD-10-CM | POA: Diagnosis not present

## 2013-11-29 DIAGNOSIS — E039 Hypothyroidism, unspecified: Secondary | ICD-10-CM | POA: Diagnosis not present

## 2013-12-04 DIAGNOSIS — F331 Major depressive disorder, recurrent, moderate: Secondary | ICD-10-CM | POA: Diagnosis not present

## 2013-12-11 DIAGNOSIS — F331 Major depressive disorder, recurrent, moderate: Secondary | ICD-10-CM | POA: Diagnosis not present

## 2013-12-18 DIAGNOSIS — F331 Major depressive disorder, recurrent, moderate: Secondary | ICD-10-CM | POA: Diagnosis not present

## 2013-12-18 DIAGNOSIS — Z23 Encounter for immunization: Secondary | ICD-10-CM | POA: Diagnosis not present

## 2013-12-25 DIAGNOSIS — F331 Major depressive disorder, recurrent, moderate: Secondary | ICD-10-CM | POA: Diagnosis not present

## 2013-12-26 ENCOUNTER — Other Ambulatory Visit: Payer: Self-pay | Admitting: Internal Medicine

## 2014-01-01 DIAGNOSIS — F331 Major depressive disorder, recurrent, moderate: Secondary | ICD-10-CM | POA: Diagnosis not present

## 2014-01-08 DIAGNOSIS — F331 Major depressive disorder, recurrent, moderate: Secondary | ICD-10-CM | POA: Diagnosis not present

## 2014-01-10 DIAGNOSIS — E039 Hypothyroidism, unspecified: Secondary | ICD-10-CM | POA: Diagnosis not present

## 2014-01-15 DIAGNOSIS — F331 Major depressive disorder, recurrent, moderate: Secondary | ICD-10-CM | POA: Diagnosis not present

## 2014-01-20 ENCOUNTER — Other Ambulatory Visit: Payer: Self-pay | Admitting: Nurse Practitioner

## 2014-01-22 DIAGNOSIS — F331 Major depressive disorder, recurrent, moderate: Secondary | ICD-10-CM | POA: Diagnosis not present

## 2014-01-29 DIAGNOSIS — F331 Major depressive disorder, recurrent, moderate: Secondary | ICD-10-CM | POA: Diagnosis not present

## 2014-02-04 DIAGNOSIS — F331 Major depressive disorder, recurrent, moderate: Secondary | ICD-10-CM | POA: Diagnosis not present

## 2014-02-12 DIAGNOSIS — F331 Major depressive disorder, recurrent, moderate: Secondary | ICD-10-CM | POA: Diagnosis not present

## 2014-02-19 DIAGNOSIS — F331 Major depressive disorder, recurrent, moderate: Secondary | ICD-10-CM | POA: Diagnosis not present

## 2014-02-25 ENCOUNTER — Other Ambulatory Visit: Payer: Self-pay | Admitting: *Deleted

## 2014-02-25 MED ORDER — METOPROLOL SUCCINATE ER 25 MG PO TB24: ORAL_TABLET | ORAL | Status: DC

## 2014-02-25 NOTE — Telephone Encounter (Signed)
[facility] 

## 2014-02-26 DIAGNOSIS — F331 Major depressive disorder, recurrent, moderate: Secondary | ICD-10-CM | POA: Diagnosis not present

## 2014-03-05 DIAGNOSIS — F331 Major depressive disorder, recurrent, moderate: Secondary | ICD-10-CM | POA: Diagnosis not present

## 2014-03-19 DIAGNOSIS — F331 Major depressive disorder, recurrent, moderate: Secondary | ICD-10-CM | POA: Diagnosis not present

## 2014-03-24 ENCOUNTER — Other Ambulatory Visit: Payer: Self-pay | Admitting: Internal Medicine

## 2014-03-26 DIAGNOSIS — F331 Major depressive disorder, recurrent, moderate: Secondary | ICD-10-CM | POA: Diagnosis not present

## 2014-03-27 DIAGNOSIS — L821 Other seborrheic keratosis: Secondary | ICD-10-CM | POA: Diagnosis not present

## 2014-03-27 DIAGNOSIS — L219 Seborrheic dermatitis, unspecified: Secondary | ICD-10-CM | POA: Diagnosis not present

## 2014-04-08 ENCOUNTER — Encounter: Payer: Self-pay | Admitting: Internal Medicine

## 2014-04-09 ENCOUNTER — Telehealth: Payer: Self-pay | Admitting: Internal Medicine

## 2014-04-09 ENCOUNTER — Encounter: Payer: Self-pay | Admitting: Internal Medicine

## 2014-04-09 ENCOUNTER — Non-Acute Institutional Stay: Payer: Medicare Other | Admitting: Internal Medicine

## 2014-04-09 VITALS — BP 138/64 | HR 76 | Temp 98.1°F | Wt 144.0 lb

## 2014-04-09 DIAGNOSIS — F411 Generalized anxiety disorder: Secondary | ICD-10-CM | POA: Diagnosis not present

## 2014-04-09 DIAGNOSIS — Z79890 Hormone replacement therapy (postmenopausal): Secondary | ICD-10-CM | POA: Diagnosis not present

## 2014-04-09 DIAGNOSIS — E785 Hyperlipidemia, unspecified: Secondary | ICD-10-CM

## 2014-04-09 DIAGNOSIS — E039 Hypothyroidism, unspecified: Secondary | ICD-10-CM

## 2014-04-09 DIAGNOSIS — Z23 Encounter for immunization: Secondary | ICD-10-CM

## 2014-04-09 DIAGNOSIS — G894 Chronic pain syndrome: Secondary | ICD-10-CM | POA: Diagnosis not present

## 2014-04-09 DIAGNOSIS — L219 Seborrheic dermatitis, unspecified: Secondary | ICD-10-CM

## 2014-04-09 DIAGNOSIS — E7849 Other hyperlipidemia: Secondary | ICD-10-CM

## 2014-04-09 DIAGNOSIS — M810 Age-related osteoporosis without current pathological fracture: Secondary | ICD-10-CM

## 2014-04-09 DIAGNOSIS — R4189 Other symptoms and signs involving cognitive functions and awareness: Secondary | ICD-10-CM

## 2014-04-09 DIAGNOSIS — N3941 Urge incontinence: Secondary | ICD-10-CM

## 2014-04-09 NOTE — Telephone Encounter (Signed)
Spoke with Mr. Caratachea. Says he did not think he should be charged any formed filled out by our providers.  Told pt fees are not dictated by [facility].  We bill for formed filled out by our providers.  Mr. Heathcock asked for a copy of the form.  Sent Mr. Lynds a copy of our form billing process per his request.  cdavis

## 2014-04-09 NOTE — Telephone Encounter (Signed)
-----   Message from Logan Bores, [place] sent at 03/25/2014 11:54 AM EST ----- Call patient's husband to further discuss bill. Patient already spoken with Suzette. Bold number on file is correct

## 2014-04-09 NOTE — Progress Notes (Signed)
Patient ID: Kelly Irwin, female   DOB: [birth year], 81 y.o.   MRN: [id number]   Location:  [facility]  Code Status: DNR, has living will and hcpoa  Allergies  Allergen Reactions  . Cozaar [Losartan Potassium]   . Doxycycline   . Lamictal [Lamotrigine]   . Neurontin [Gabapentin]   . Nsaids     Chief Complaint  Patient presents with  . Medical Management of Chronic Issues    blood pressure, thyroid, anxiety  . form    from [facility] regarding Omeprazole    HPI: Patient is a 81 y.o. white female seen in the office today for med mgt chronic diseases.  Went to dermatology for severe seborrheic dermatitis.  Only to use ponds in addition to her medication along the hairline.  Has improved significantly.    Says she is here for check up and to see the skin orders.    Anxiety has been longstanding, but much improved.  Rarely uses lorazepam.   Buspar was recently increased.  Lamictal considered, but skin condition led to a halt with that.  Sees Dr. Caprice Beaver about this.  Abilify and zoloft together have been excellent combination.    Chronic pain was a longstanding problem also helped with methadone.  Had been on multiple different medications, also in [place].  Pain is primarily in left lower back region.  Has had three prior back surgeries with hardware and scar tissue.    Functional status.  In assisted living with additional three hours personal care in mornings to get her up and ready and for executive stuff.  Lives with her husband.  He is gone three mornings a week at HD.  They moved 3 mos ago to [place].  Had been in independent living in [place] previously.    Arthritis of back and hands, but don't work like they used to though pain is not bad.  She's had some "arthritis of the brain".  Has a lot of elderly relatives.  Mother deceased at 17 yo.  Father 63yo with AD.  Pt loves classical music--she's an expert on the music.    Does have some  indigestion occasionally--took milk for spasm and it dissipated.    Bowels move pretty well with methadone and miralax prn.   Estrogen has hung on for a long time--two times weekly and not touched since.    Using bedside commode for her urgency and nocturia with benefit.  Previously on vesicare w/o much benefit.     NO cardiovascular disease in her family.  Has had high HDL, high LDL and was started on statin in [place].    On thyroid meds since 42s.  Last TSH was in sept and low--dose reduced to 31mcg.    Had been on bisphosphonate--had zoledronic acid in [place] and fosamax/alendronate before that.  Bone density last was around 2006 in [place] also.    Review of Systems:  Review of Systems  Constitutional: Negative for fever.  HENT: Negative for congestion.   Eyes: Negative for blurred vision.  Respiratory: Negative for cough and shortness of breath.   Cardiovascular: Negative for chest pain.  Gastrointestinal: Positive for constipation.  Genitourinary: Positive for urgency and frequency. Negative for dysuria.  Musculoskeletal: Positive for back pain and joint pain. Negative for falls.  Skin: Negative for rash.  Neurological: Negative for dizziness and headaches.  Psychiatric/Behavioral: Positive for memory loss. The patient is nervous/anxious.     Past Medical History  Diagnosis Date  .  Spinal stenosis, unspecified region other than cervical   . Pain in joint, shoulder region   . Unspecified essential hypertension   . Other and unspecified hyperlipidemia   . Acute upper respiratory infections of unspecified site 04/28/2012  . Edema 02/09/2012  . Other specified erythematous condition(695.89) 01/24/2012  . Lumbago 12/27/2011  . Rash and other nonspecific skin eruption 12/27/2011  . Unspecified hypothyroidism 11/10/2011  . Anxiety state, unspecified 11/10/2011  . Depressive disorder, not elsewhere classified 11/10/2011  . Reflux esophagitis 11/10/2011  . Unspecified  constipation 11/10/2011  . Osteoarthrosis, unspecified whether generalized or localized, unspecified site 11/10/2011  . Backache, unspecified 11/10/2011  . Unspecified urinary incontinence 11/10/2011  . Memory loss 12/18/2010  . Unspecified hypothyroidism   . Abnormality of gait 11/15/2012    Past Surgical History  Procedure Laterality Date  . Back surgery      x3  . Rotator cuff repair Right   . Total abdominal hysterectomy w/ bilateral salpingoophorectomy  1972  . Cataract extraction w/ intraocular lens  implant, bilateral  2009-2010    Social History:   reports that she quit smoking about 65 years ago. She has never used smokeless tobacco. She reports that she drinks alcohol. She reports that she does not use illicit drugs.  Family History  Problem Relation Age of Onset  . Pneumonia Mother   . Alzheimer's disease Father     Medications: Patient's Medications  New Prescriptions   No medications on file  Previous Medications   AMMONIUM LACTATE (LAC-HYDRIN) 12 % LOTION    Apply topically. Apply to lower leg as needed for rash   ARIPIPRAZOLE (ABILIFY) 2 MG TABLET    Take 2 mg by mouth daily. Take 1 tablet at bedtime for depression   BUSPIRONE (BUSPAR) 5 MG TABLET    Take 5 mg by mouth. Take one tablet in morning, may have extra 5mg  as needed during the day   CALCIUM CARBONATE-VITAMIN D (CALCIUM 600+D3) 600-400 MG-UNIT PER TABLET    Take 1 tablet by mouth daily. Take one tablet  daily   ESTRADIOL (ESTRACE) 0.5 MG TABLET    TAKE ONE TABLET ON MONDAY AND THURSDAY FOR HORMONE SUPPLEMENT   FLUOCINOLONE (SYNALAR) 0.01 % EXTERNAL SOLUTION    Apply to scalp, face and ears for 2 weeks twice a day   IPRATROPIUM-ALBUTEROL (DUONEB) 0.5-2.5 (3) MG/3ML SOLN    Take 3 mLs by nebulization every 6 (six) hours as needed.    KETOCONAZOLE (NIZORAL) 2 % CREAM    Apply to face and ears twice a day   KETOCONAZOLE (NIZORAL) 2 % SHAMPOO    Apply to scalp let sit 5 minutes then rinse on Mon, Wed, Friday    LEVOTHYROXINE (SYNTHROID, LEVOTHROID) 100 MCG TABLET    Take one tablet once a day for thyroid supplement   LEVOTHYROXINE (SYNTHROID, LEVOTHROID) 75 MCG TABLET    TAKE ONE TABLET EACH DAY   LORAZEPAM (ATIVAN) 0.5 MG TABLET    Take 0.5 mg by mouth. Take one tablet at bedtime   MELATONIN 3 MG CAPS    Take by mouth. Take one capsule daily   METHADONE (DOLOPHINE) 5 MG TABLET    Take 1 tablet (5 mg total) by mouth as directed. Take 1 1/2 tablet (7.5mg ) three times daily  to treat chronic pain   METOPROLOL SUCCINATE (TOPROL-XL) 25 MG 24 HR TABLET    Take one tablet every day for blood pressure   MULTIPLE VITAMINS-MINERALS (CARRAVITE PO)    Take by mouth. Take  one tablet daily   NYSTATIN-TRIAMCINOLONE OINTMENT (MYCOLOG)    Apply topically. Apply daily to inflammatory folds under breast once a morning   OMEPRAZOLE (PRILOSEC) 20 MG CAPSULE    TAKE ONE CAPSULE TWICE A DAY TO REDUCE STOMACH ACID   POLYETHYLENE GLYCOL (MIRALAX / GLYCOLAX) PACKET    Take 17 g by mouth daily. For constipation. Hold for loose stool   SERTRALINE (ZOLOFT) 50 MG TABLET    TAKE ONE TABLET EACH DAY FOR DEPRESSION   TALC (ZEASORB) POWDER    Apply topically. Apply twice daily to areas under breast and on feet  Modified Medications   No medications on file  Discontinued Medications   No medications on file     Physical Exam: Filed Vitals:   04/09/14 1536  BP: 138/64  Pulse: 76  Temp: 98.1 F (36.7 C)  TempSrc: Oral  Weight: 144 lb (65.318 kg)  Physical Exam  Constitutional: She is oriented to person, place, and time. She appears well-developed and well-nourished. No distress.  HENT:  Head: Normocephalic and atraumatic.  Eyes: EOM are normal. Pupils are equal, round, and reactive to light.  Cardiovascular: Normal rate, regular rhythm, normal heart sounds and intact distal pulses.   Pulmonary/Chest: Effort normal and breath sounds normal. No respiratory distress.  Abdominal: Soft. Bowel sounds are normal. She exhibits no  distension and no mass. There is no tenderness.  Musculoskeletal:  No localized tenderness in low back   Neurological: She is alert and oriented to person, place, and time.  Skin: Skin is warm and dry.  Psychiatric: She has a normal mood and affect.     Labs reviewed: Basic Metabolic Panel:  Recent Labs  05/10/13 05/11/13  NA 132* 133*  K 6.4* 4.2  BUN 15 13  CREATININE 0.9 0.8   Liver Function Tests: No results for input(s): AST, ALT, ALKPHOS, BILITOT, PROT, ALBUMIN in the last 8760 hours. No results for input(s): LIPASE, AMYLASE in the last 8760 hours. No results for input(s): AMMONIA in the last 8760 hours. CBC:  Recent Labs  05/10/13 05/11/13  WBC 5.7 4.6  HGB 13.4 12.8  HCT 39 37  PLT 126* 133*    Assessment/Plan 1. Need for vaccination with 13-polyvalent pneumococcal conjugate vaccine - Pneumococcal conjugate vaccine 13-valent given  2. Seborrheic dermatitis -cont current therapy per dermatology  3. Generalized anxiety disorder -cont abilify, buspar, ativan, zoloft and melatonin for sleep  4. Chronic pain syndrome -cont methadone  5. Urge urinary incontinence If she does well with estrogen taper to off, we may consider myrbetriq at next visit  6. Senile osteoporosis -tapering off estrogen therapy, prior bisphosphonate use, could consider prolia if desired; cont ca with D  7. Hypothyroidism, unspecified hypothyroidism type -cont current synthroid, f/u tsh  8. Cognitive decline -seems to have mild cognitive impairment at this stage; functional dependence as in hpi seems primarily due to physical rather than cognitive impairments -has significant fh/o dementia and pt is very sensitive to her memory loss concerns  9. Hyperlipidemia, familial, high LDL -not on medications for this; has always had the high HDL and opted against treatment  10.  Postmenopausal hormone replacement: decrease estrogen to q Monday for 2 wks then stop -if atrophic vaginitis  becomes problematic, would use local estrogen cream   Next appt:  3 mos  Lavetta Geier L. Tycho Cheramie, D.O. [facility] [address], [place] [address] Cell Phone (Mon-Fri 8am-5pm):  [phone] On Call:  [phone] & follow  prompts after 5pm & weekends Office Phone:  [phone] Office Fax:  [phone]

## 2014-04-10 ENCOUNTER — Telehealth: Payer: Self-pay | Admitting: *Deleted

## 2014-04-10 NOTE — Telephone Encounter (Signed)
Received Prior Auth from [facility] [phone] Fax: [phone] for patient's Estradiol 0.5 Tablets. Given to Dr. Mariea Clonts for review and signature.

## 2014-04-11 DIAGNOSIS — E039 Hypothyroidism, unspecified: Secondary | ICD-10-CM | POA: Diagnosis not present

## 2014-04-11 LAB — TSH: TSH: 0.35 u[IU]/mL — AB (ref 0.41–5.90)

## 2014-04-16 DIAGNOSIS — F331 Major depressive disorder, recurrent, moderate: Secondary | ICD-10-CM | POA: Diagnosis not present

## 2014-04-17 DIAGNOSIS — G894 Chronic pain syndrome: Secondary | ICD-10-CM | POA: Insufficient documentation

## 2014-04-17 DIAGNOSIS — L219 Seborrheic dermatitis, unspecified: Secondary | ICD-10-CM | POA: Insufficient documentation

## 2014-04-17 DIAGNOSIS — Z79890 Hormone replacement therapy (postmenopausal): Secondary | ICD-10-CM | POA: Insufficient documentation

## 2014-04-17 DIAGNOSIS — N3941 Urge incontinence: Secondary | ICD-10-CM | POA: Insufficient documentation

## 2014-04-17 DIAGNOSIS — M810 Age-related osteoporosis without current pathological fracture: Secondary | ICD-10-CM | POA: Insufficient documentation

## 2014-04-23 DIAGNOSIS — F331 Major depressive disorder, recurrent, moderate: Secondary | ICD-10-CM | POA: Diagnosis not present

## 2014-05-07 DIAGNOSIS — F331 Major depressive disorder, recurrent, moderate: Secondary | ICD-10-CM | POA: Diagnosis not present

## 2014-05-13 ENCOUNTER — Other Ambulatory Visit: Payer: Self-pay | Admitting: Nurse Practitioner

## 2014-05-14 DIAGNOSIS — F331 Major depressive disorder, recurrent, moderate: Secondary | ICD-10-CM | POA: Diagnosis not present

## 2014-05-21 DIAGNOSIS — F331 Major depressive disorder, recurrent, moderate: Secondary | ICD-10-CM | POA: Diagnosis not present

## 2014-05-28 DIAGNOSIS — F331 Major depressive disorder, recurrent, moderate: Secondary | ICD-10-CM | POA: Diagnosis not present

## 2014-05-30 ENCOUNTER — Other Ambulatory Visit: Payer: Self-pay | Admitting: Internal Medicine

## 2014-06-04 DIAGNOSIS — F331 Major depressive disorder, recurrent, moderate: Secondary | ICD-10-CM | POA: Diagnosis not present

## 2014-06-06 DIAGNOSIS — L280 Lichen simplex chronicus: Secondary | ICD-10-CM | POA: Diagnosis not present

## 2014-06-06 DIAGNOSIS — L219 Seborrheic dermatitis, unspecified: Secondary | ICD-10-CM | POA: Diagnosis not present

## 2014-06-11 DIAGNOSIS — F331 Major depressive disorder, recurrent, moderate: Secondary | ICD-10-CM | POA: Diagnosis not present

## 2014-06-18 DIAGNOSIS — F331 Major depressive disorder, recurrent, moderate: Secondary | ICD-10-CM | POA: Diagnosis not present

## 2014-07-02 ENCOUNTER — Non-Acute Institutional Stay: Payer: Medicare Other | Admitting: Internal Medicine

## 2014-07-02 ENCOUNTER — Encounter: Payer: Self-pay | Admitting: Internal Medicine

## 2014-07-02 VITALS — BP 100/60 | HR 68 | Temp 97.8°F | Wt 146.0 lb

## 2014-07-02 DIAGNOSIS — F411 Generalized anxiety disorder: Secondary | ICD-10-CM

## 2014-07-02 DIAGNOSIS — E039 Hypothyroidism, unspecified: Secondary | ICD-10-CM | POA: Diagnosis not present

## 2014-07-02 DIAGNOSIS — N3941 Urge incontinence: Secondary | ICD-10-CM | POA: Diagnosis not present

## 2014-07-02 DIAGNOSIS — B372 Candidiasis of skin and nail: Secondary | ICD-10-CM

## 2014-07-02 DIAGNOSIS — G894 Chronic pain syndrome: Secondary | ICD-10-CM | POA: Diagnosis not present

## 2014-07-02 NOTE — Progress Notes (Signed)
Patient ID: Kelly Irwin, female   DOB: January 23, 1928, 81 y.o.   MRN: [id number]   Location:  [facility]  Code Status: DNR  Goals of Care: Advanced Directive information Does patient have an advance directive?: Yes, Type of Advance Directive: [place];Out of facility DNR (pink MOST or yellow form), Pre-existing out of facility DNR order (yellow form or pink MOST form): Yellow form placed in chart (order not valid for inpatient use), Does patient want to make changes to advanced directive?: No - Patient declined   Allergies  Allergen Reactions  . Cozaar [Losartan Potassium] Swelling  . Doxycycline   . Lamictal [Lamotrigine]   . Nsaids   . Neurontin [Gabapentin]     Chief Complaint  Patient presents with  . Medical Management of Chronic Issues    blood pressure, thyroid, chronic pain, anxiety  . Rash    under right breast, redder, using the power twice daily    HPI: Patient is a 81 y.o. white female seen in the office today for med mgt of chronic diseases.     Right breast redder beneath--powder seems to be making it worse instead of better.  Left breast hardly red at all beneath.    Mood is good.  Anxiety unchanged.  Blood pressure is good.  On lower end of normal today.    Estrogen down to once a week.  She is tolerating this.  Her daughter does not want her to go on myrbetriq.  Uses bedside commode at night, but she's functionally slow.  She doesn't think it will help.   Attending exercise classes.  After speaking with her daughter on the phone, pt pointed out that she has a sore on her 2nd toe of her right foot (hammertoe) from it rubbing--recommended a cushioned dressing and podiatry visit.  Review of Systems:  Review of Systems  Constitutional: Negative for fever.  Eyes: Negative for blurred vision.  Respiratory: Negative for shortness of breath.   Cardiovascular: Positive for leg swelling. Negative for chest pain.  Gastrointestinal:  Negative for abdominal pain, blood in stool and melena.  Genitourinary: Positive for urgency and frequency. Negative for dysuria and hematuria.  Musculoskeletal: Positive for myalgias, back pain and neck pain. Negative for falls.  Skin: Positive for itching and rash.  Neurological: Negative for dizziness.  Psychiatric/Behavioral: Positive for memory loss. Negative for depression.     Past Medical History  Diagnosis Date  . Spinal stenosis, unspecified region other than cervical   . Pain in joint, shoulder region   . Unspecified essential hypertension   . Other and unspecified hyperlipidemia   . Acute upper respiratory infections of unspecified site 04/28/2012  . Edema 02/09/2012  . Other specified erythematous condition(695.89) 01/24/2012  . Lumbago 12/27/2011  . Rash and other nonspecific skin eruption 12/27/2011  . Unspecified hypothyroidism 11/10/2011  . Anxiety state, unspecified 11/10/2011  . Depressive disorder, not elsewhere classified 11/10/2011  . Reflux esophagitis 11/10/2011  . Unspecified constipation 11/10/2011  . Osteoarthrosis, unspecified whether generalized or localized, unspecified site 11/10/2011  . Backache, unspecified 11/10/2011  . Unspecified urinary incontinence 11/10/2011  . Memory loss 12/18/2010  . Unspecified hypothyroidism   . Abnormality of gait 11/15/2012    Past Surgical History  Procedure Laterality Date  . Back surgery      x3  . Rotator cuff repair Right   . Total abdominal hysterectomy w/ bilateral salpingoophorectomy  1972  . Cataract extraction w/ intraocular lens  implant, bilateral  2009-2010  Social History:   reports that she quit smoking about 65 years ago. She has never used smokeless tobacco. She reports that she drinks alcohol. She reports that she does not use illicit drugs.  Family History  Problem Relation Age of Onset  . Pneumonia Mother   . Alzheimer's disease Father     Medications: Patient's Medications  New Prescriptions    No medications on file  Previous Medications   ARIPIPRAZOLE (ABILIFY) 2 MG TABLET    Take 2 mg by mouth daily. Take 1 tablet at bedtime for depression   BUSPIRONE (BUSPAR) 15 MG TABLET    Take one tablet twice daily   CALCIUM CARBONATE-VITAMIN D (CALCIUM 600+D3) 600-400 MG-UNIT PER TABLET    Take 1 tablet by mouth daily. Take one tablet  daily   HYDROCORTISONE 2.5 % LOTION    Apply to face at bedtime   KETOCONAZOLE (NIZORAL) 2 % SHAMPOO    Apply to scalp let sit 5 minutes then rinse once a week   LEVOTHYROXINE (SYNTHROID, LEVOTHROID) 75 MCG TABLET    TAKE ONE TABLET EACH DAY   LORAZEPAM (ATIVAN) 0.5 MG TABLET    Take 0.5 mg by mouth. Take one tablet at bedtime   MELATONIN 3 MG CAPS    Take by mouth. Take one capsule daily   METHADONE (DOLOPHINE) 5 MG TABLET    Take 1 tablet (5 mg total) by mouth as directed. Take 1 1/2 tablet (7.5mg ) three times daily  to treat chronic pain   METOPROLOL SUCCINATE (TOPROL-XL) 25 MG 24 HR TABLET    Take one tablet every day for blood pressure   MULTIPLE VITAMINS-MINERALS (CARRAVITE PO)    Take by mouth. Take one tablet daily   NYSTATIN-TRIAMCINOLONE OINTMENT (MYCOLOG)    Apply topically. Apply daily to inflammatory folds under breast once a morning   OMEPRAZOLE (PRILOSEC) 20 MG CAPSULE    TAKE ONE CAPSULE TWICE A DAY TO REDUCE STOMACH ACID   POLYETHYLENE GLYCOL (MIRALAX / GLYCOLAX) PACKET    Take 17 g by mouth daily. For constipation. Hold for loose stool   PYRITHIONE ZINC (HEAD AND SHOULDERS) 1 % SHAMPOO    Apply topically. Wash face daily round hairline   SERTRALINE (ZOLOFT) 50 MG TABLET    TAKE ONE TABLET EACH DAY FOR DEPRESSION  Modified Medications   No medications on file  Discontinued Medications   AMMONIUM LACTATE (LAC-HYDRIN) 12 % LOTION    Apply topically. Apply to lower leg as needed for rash   BUSPIRONE (BUSPAR) 5 MG TABLET    Take 5 mg by mouth. Take one tablet in morning, may have extra 5mg  as needed during the day   ESTRADIOL (ESTRACE) 0.5 MG  TABLET    TAKE ONE TABLET ON MONDAY AND THURSDAY FOR HORMONE SUPPLEMENT   FLUOCINOLONE (SYNALAR) 0.01 % EXTERNAL SOLUTION    Apply to scalp, face and ears for 2 weeks twice a day   IPRATROPIUM-ALBUTEROL (DUONEB) 0.5-2.5 (3) MG/3ML SOLN    Take 3 mLs by nebulization every 6 (six) hours as needed.    KETOCONAZOLE (NIZORAL) 2 % CREAM    APPLY TO FACE/EARS TWICE DAILY- START IN2 WEEKS AFTER FINISHING FLUOCINOLONE SOLUTION   LEVOTHYROXINE (SYNTHROID, LEVOTHROID) 100 MCG TABLET    Take one tablet once a day for thyroid supplement   TALC (ZEASORB) POWDER    Apply topically. Apply twice daily to areas under breast and on feet     Physical Exam: Filed Vitals:   07/02/14 1500  BP: 100/60  Pulse: 68  Temp: 97.8 F (36.6 C)  TempSrc: Oral  Weight: 146 lb (66.225 kg)  Physical Exam  Constitutional: She is oriented to person, place, and time. She appears well-developed and well-nourished. No distress.  Cardiovascular: Normal rate, regular rhythm, normal heart sounds and intact distal pulses.   Pulmonary/Chest: Effort normal and breath sounds normal.  Abdominal: Soft. Bowel sounds are normal.  Musculoskeletal:  Walks with walker  Neurological: She is alert and oriented to person, place, and time.  Skin:  [place] red smooth area beneath right breast with some excoriated areas; left is normal beneath; also has small pressure injury on right second toe due to hammertoe pressing against the dorsal aspect of her shoes (has specialized shoes)  Psychiatric: She has a normal mood and affect.  Very pleasant and in good spirits today    Labs reviewed: Basic Metabolic Panel:  Recent Labs  04/11/14  TSH 0.35*   Assessment/Plan 1. Candidal skin infection -will prescribe nystatin cream in place of powder at present due to open areas, powder used here should always be talc free due to potential excoriation with talc products  2. Chronic pain syndrome -her daughter would like her methadone gradually  reduced -pt is comfortable today and doing great so I did not rock the boat from this perspective, but she is on methadone -moods seem closely tied with her pain control and spirits are also excellent right now  3. Urge urinary incontinence -recommended myrbetriq but her daughter disagreed  -does have functional deficits, as well, but fewer times up at night should be helpful and this drug is not anticholinergic like it's counterparts or I would not recommend it -apparently resident stays up too late at night watching tv  4. Generalized anxiety disorder -continues on zoloft, ativan, buspar, and abilify with benefit -mood is excellent today  5. Hypothyroidism, unspecified hypothyroidism type -cont synthroid 54mcg daily and recheck tsh as it was low in January  Labs/tests ordered:  TSH Next appt:  3 mos  Terissa Haffey L. Brooks Kinnan, D.O. [employer] [address], [place] [address] Cell Phone (Mon-Fri 8am-5pm):  [phone] On Call:  [phone] & follow prompts after 5pm & weekends Office Phone:  [phone] Office Fax:  [phone]

## 2014-07-08 NOTE — Progress Notes (Signed)
Patient ID: Kelly Irwin, female   DOB: 1927/09/10, 81 y.o.   MRN: [id number] Please note that [name] is actually off of estrogen altogether at this time.     Medication List       This list is accurate as of: 07/02/14 11:59 PM.  Always use your most recent med list.               ARIPiprazole 2 MG tablet  Commonly known as:  ABILIFY  Take 2 mg by mouth daily. Take 1 tablet at bedtime for depression     busPIRone 15 MG tablet  Commonly known as:  BUSPAR  Take one tablet twice daily     CALCIUM 600+D3 600-400 MG-UNIT per tablet  Generic drug:  Calcium Carbonate-Vitamin D  Take 1 tablet by mouth daily. Take one tablet  daily     CARRAVITE PO  Take by mouth. Take one tablet daily     hydrocortisone 2.5 % lotion  Apply to face at bedtime     ketoconazole 2 % shampoo  Commonly known as:  NIZORAL  Apply to scalp let sit 5 minutes then rinse once a week     levothyroxine 75 MCG tablet  Commonly known as:  SYNTHROID, LEVOTHROID  TAKE ONE TABLET EACH DAY     LORazepam 0.5 MG tablet  Commonly known as:  ATIVAN  Take 0.5 mg by mouth. Take one tablet at bedtime     Melatonin 3 MG Caps  Take by mouth. Take one capsule daily     methadone 5 MG tablet  Commonly known as:  DOLOPHINE  Take 1 tablet (5 mg total) by mouth as directed. Take 1 1/2 tablet (7.5mg ) three times daily  to treat chronic pain     metoprolol succinate 25 MG 24 hr tablet  Commonly known as:  TOPROL-XL  Take one tablet every day for blood pressure     omeprazole 20 MG capsule  Commonly known as:  PRILOSEC  TAKE ONE CAPSULE TWICE A DAY TO REDUCE STOMACH ACID     polyethylene glycol packet  Commonly known as:  MIRALAX / GLYCOLAX  Take 17 g by mouth daily. For constipation. Hold for loose stool     pyrithione zinc 1 % shampoo  Commonly known as:  HEAD AND SHOULDERS  Apply topically. Wash face daily round hairline     sertraline 50 MG tablet  Commonly known as:  ZOLOFT  TAKE ONE TABLET EACH DAY FOR  DEPRESSION

## 2014-07-09 DIAGNOSIS — F331 Major depressive disorder, recurrent, moderate: Secondary | ICD-10-CM | POA: Diagnosis not present

## 2014-07-21 ENCOUNTER — Other Ambulatory Visit: Payer: Self-pay | Admitting: Nurse Practitioner

## 2014-07-23 DIAGNOSIS — B351 Tinea unguium: Secondary | ICD-10-CM | POA: Diagnosis not present

## 2014-07-23 DIAGNOSIS — L84 Corns and callosities: Secondary | ICD-10-CM | POA: Diagnosis not present

## 2014-07-23 DIAGNOSIS — M2041 Other hammer toe(s) (acquired), right foot: Secondary | ICD-10-CM | POA: Diagnosis not present

## 2014-07-23 DIAGNOSIS — F331 Major depressive disorder, recurrent, moderate: Secondary | ICD-10-CM | POA: Diagnosis not present

## 2014-07-23 DIAGNOSIS — M2042 Other hammer toe(s) (acquired), left foot: Secondary | ICD-10-CM | POA: Diagnosis not present

## 2014-07-24 ENCOUNTER — Encounter: Payer: Self-pay | Admitting: Nurse Practitioner

## 2014-07-24 ENCOUNTER — Non-Acute Institutional Stay: Payer: Medicare Other | Admitting: Nurse Practitioner

## 2014-07-24 VITALS — BP 120/64 | HR 68 | Temp 97.8°F | Wt 146.0 lb

## 2014-07-24 DIAGNOSIS — R21 Rash and other nonspecific skin eruption: Secondary | ICD-10-CM

## 2014-07-24 DIAGNOSIS — K5900 Constipation, unspecified: Secondary | ICD-10-CM

## 2014-07-24 NOTE — Progress Notes (Signed)
Patient ID: Kelly Irwin, female   DOB: 12/11/1927, 81 y.o.   MRN: [id number]    Nursing Home Location:  [facility] (12)  PCP: REED, TIFFANY, DO  Allergies  Allergen Reactions  . Cozaar [Losartan Potassium] Swelling  . Doxycycline   . Lamictal [Lamotrigine]   . Nsaids   . Neurontin [Gabapentin]     Chief Complaint  Patient presents with  . Rash    on top left breast for one week   . Constipation    better since taking 1/2 dose of Miralax daily, was not using daily before    HPI:  Patient is a 81 y.o. female seen today at [facility] in clinic to follow up rash and constipation. Using triamcinolone Ointment to left breast for 5 days and already seeing an improvement. Was very itchy and now it is not itching at all. Overall rash has faded significantly.  Miralax was changed due to constipation, was not taking it on a regular basis, currently taking miralax 1/2 cap daily which has helped.  Now bowels are regular.   Review of Systems:  Review of Systems  Constitutional: Negative for fever, chills, activity change and appetite change.  Respiratory: Negative for shortness of breath.   Cardiovascular: Negative for chest pain.  Gastrointestinal: Negative for abdominal pain and constipation.  Skin: Positive for rash (improved).  Neurological: Negative for dizziness.    Past Medical History  Diagnosis Date  . Spinal stenosis, unspecified region other than cervical   . Pain in joint, shoulder region   . Unspecified essential hypertension   . Other and unspecified hyperlipidemia   . Acute upper respiratory infections of unspecified site 04/28/2012  . Edema 02/09/2012  . Other specified erythematous condition(695.89) 01/24/2012  . Lumbago 12/27/2011  . Rash and other nonspecific skin eruption 12/27/2011  . Unspecified hypothyroidism 11/10/2011  . Anxiety state, unspecified 11/10/2011  . Depressive disorder, not  elsewhere classified 11/10/2011  . Reflux esophagitis 11/10/2011  . Unspecified constipation 11/10/2011  . Osteoarthrosis, unspecified whether generalized or localized, unspecified site 11/10/2011  . Backache, unspecified 11/10/2011  . Unspecified urinary incontinence 11/10/2011  . Memory loss 12/18/2010  . Unspecified hypothyroidism   . Abnormality of gait 11/15/2012   Past Surgical History  Procedure Laterality Date  . Back surgery      x3  . Rotator cuff repair Right   . Total abdominal hysterectomy w/ bilateral salpingoophorectomy  1972  . Cataract extraction w/ intraocular lens  implant, bilateral  2009-2010   Social History:   reports that she quit smoking about 65 years ago. She has never used smokeless tobacco. She reports that she drinks alcohol. She reports that she does not use illicit drugs.  Family History  Problem Relation Age of Onset  . Pneumonia Mother   . Alzheimer's disease Father     Medications: Patient's Medications  New Prescriptions   No medications on file  Previous Medications   ARIPIPRAZOLE (ABILIFY) 2 MG TABLET    Take 2 mg by mouth daily. Take 1 tablet at bedtime for depression   BUSPIRONE (BUSPAR) 15 MG TABLET    Take one tablet twice daily   CALCIUM CARBONATE-VITAMIN D (CALCIUM 600+D3) 600-400 MG-UNIT PER TABLET    Take 1 tablet by mouth daily. Take one tablet  daily   CETAPHIL (CETAPHIL) LOTION    Apply 1 application topically. Apply twice a day as needed for dry, scaly skin   HYDROCORTISONE 2.5 %  LOTION    Apply to face at bedtime   KETOCONAZOLE (NIZORAL) 2 % SHAMPOO    Apply to scalp let sit 5 minutes then rinse once a week   LEVOTHYROXINE (SYNTHROID, LEVOTHROID) 75 MCG TABLET    TAKE ONE TABLET EVERY DAY   LORAZEPAM (ATIVAN) 0.5 MG TABLET    Take 0.5 mg by mouth. Take one tablet at bedtime   MELATONIN 3 MG CAPS    Take by mouth. Take one capsule daily   METHADONE (DOLOPHINE) 5 MG TABLET    Take 1 tablet (5 mg total) by mouth as directed. Take 1 1/2  tablet (7.5mg ) three times daily  to treat chronic pain   METOPROLOL SUCCINATE (TOPROL-XL) 25 MG 24 HR TABLET    Take one tablet every day for blood pressure   MULTIPLE VITAMINS-MINERALS (CARRAVITE PO)    Take by mouth. Take one tablet daily   OMEPRAZOLE (PRILOSEC) 20 MG CAPSULE    TAKE ONE CAPSULE TWICE A DAY TO REDUCE STOMACH ACID   POLYETHYLENE GLYCOL (MIRALAX / GLYCOLAX) PACKET    Take 17 g by mouth daily. For constipation. Hold for loose stool   PYRITHIONE ZINC (HEAD AND SHOULDERS) 1 % SHAMPOO    Apply topically. Wash face daily round hairline   SERTRALINE (ZOLOFT) 50 MG TABLET    TAKE ONE TABLET EACH DAY FOR DEPRESSION   TRIAMCINOLONE OINTMENT (KENALOG) 0.1 %    Apply 1 application topically. Apply a thin layer to left breast twice daily for 14 days  Modified Medications   No medications on file  Discontinued Medications   No medications on file     Physical Exam: Filed Vitals:   07/24/14 1006  BP: 120/64  Pulse: 68  Temp: 97.8 F (36.6 C)  TempSrc: Oral  Weight: 146 lb (66.225 kg)    Physical Exam  Constitutional: She is oriented to person, place, and time. She appears well-developed and well-nourished. No distress.  Cardiovascular: Normal rate, regular rhythm and normal heart sounds.   Pulmonary/Chest: Effort normal and breath sounds normal.  Abdominal: Soft. Bowel sounds are normal.  Musculoskeletal:  Walks with walker  Neurological: She is alert and oriented to person, place, and time.  Skin:  Faint pink area to left breast, ointment has been applied   Psychiatric: She has a normal mood and affect.    Labs reviewed: Basic Metabolic Panel: No results for input(s): NA, K, CL, CO2, GLUCOSE, BUN, CREATININE, CALCIUM, MG, PHOS in the last 8760 hours. Liver Function Tests: No results for input(s): AST, ALT, ALKPHOS, BILITOT, PROT, ALBUMIN in the last 8760 hours. No results for input(s): LIPASE, AMYLASE in the last 8760 hours. No results for input(s): AMMONIA in the  last 8760 hours. CBC: No results for input(s): WBC, NEUTROABS, HGB, HCT, MCV, PLT in the last 8760 hours. TSH:  Recent Labs  04/11/14  TSH 0.35*   A1C: No results found for: HGBA1C Lipid Panel: No results for input(s): CHOL, HDL, LDLCALC, TRIG, CHOLHDL, LDLDIRECT in the last 8760 hours.   Assessment/Plan 1. Rash and nonspecific skin eruption - to cont triamcinolone BID for a total of 14 days, already with significant improvement  2. Constipation, unspecified constipation type -well controlled on current miralax regimen

## 2014-07-25 DIAGNOSIS — E871 Hypo-osmolality and hyponatremia: Secondary | ICD-10-CM | POA: Diagnosis not present

## 2014-07-25 DIAGNOSIS — E039 Hypothyroidism, unspecified: Secondary | ICD-10-CM | POA: Diagnosis not present

## 2014-07-30 DIAGNOSIS — F331 Major depressive disorder, recurrent, moderate: Secondary | ICD-10-CM | POA: Diagnosis not present

## 2014-08-06 DIAGNOSIS — F331 Major depressive disorder, recurrent, moderate: Secondary | ICD-10-CM | POA: Diagnosis not present

## 2014-08-13 DIAGNOSIS — F411 Generalized anxiety disorder: Secondary | ICD-10-CM | POA: Diagnosis not present

## 2014-08-13 DIAGNOSIS — F331 Major depressive disorder, recurrent, moderate: Secondary | ICD-10-CM | POA: Diagnosis not present

## 2014-08-25 ENCOUNTER — Other Ambulatory Visit: Payer: Self-pay | Admitting: Internal Medicine

## 2014-08-28 ENCOUNTER — Other Ambulatory Visit: Payer: Self-pay | Admitting: Internal Medicine

## 2014-09-03 DIAGNOSIS — F411 Generalized anxiety disorder: Secondary | ICD-10-CM | POA: Diagnosis not present

## 2014-09-03 DIAGNOSIS — F331 Major depressive disorder, recurrent, moderate: Secondary | ICD-10-CM | POA: Diagnosis not present

## 2014-09-10 DIAGNOSIS — F411 Generalized anxiety disorder: Secondary | ICD-10-CM | POA: Diagnosis not present

## 2014-09-10 DIAGNOSIS — F331 Major depressive disorder, recurrent, moderate: Secondary | ICD-10-CM | POA: Diagnosis not present

## 2014-09-17 DIAGNOSIS — F331 Major depressive disorder, recurrent, moderate: Secondary | ICD-10-CM | POA: Diagnosis not present

## 2014-09-17 DIAGNOSIS — F411 Generalized anxiety disorder: Secondary | ICD-10-CM | POA: Diagnosis not present

## 2014-09-24 DIAGNOSIS — F331 Major depressive disorder, recurrent, moderate: Secondary | ICD-10-CM | POA: Diagnosis not present

## 2014-09-24 DIAGNOSIS — F411 Generalized anxiety disorder: Secondary | ICD-10-CM | POA: Diagnosis not present

## 2014-10-01 ENCOUNTER — Encounter: Payer: Self-pay | Admitting: Internal Medicine

## 2014-10-01 DIAGNOSIS — F411 Generalized anxiety disorder: Secondary | ICD-10-CM | POA: Diagnosis not present

## 2014-10-01 DIAGNOSIS — F331 Major depressive disorder, recurrent, moderate: Secondary | ICD-10-CM | POA: Diagnosis not present

## 2014-10-02 ENCOUNTER — Non-Acute Institutional Stay: Payer: Medicare Other | Admitting: Internal Medicine

## 2014-10-02 ENCOUNTER — Encounter: Payer: Self-pay | Admitting: Internal Medicine

## 2014-10-02 VITALS — BP 122/64 | HR 68 | Temp 98.0°F | Wt 147.0 lb

## 2014-10-02 DIAGNOSIS — N3941 Urge incontinence: Secondary | ICD-10-CM

## 2014-10-02 DIAGNOSIS — B372 Candidiasis of skin and nail: Secondary | ICD-10-CM

## 2014-10-02 DIAGNOSIS — M2042 Other hammer toe(s) (acquired), left foot: Secondary | ICD-10-CM

## 2014-10-02 DIAGNOSIS — M2041 Other hammer toe(s) (acquired), right foot: Secondary | ICD-10-CM

## 2014-10-02 DIAGNOSIS — E039 Hypothyroidism, unspecified: Secondary | ICD-10-CM | POA: Diagnosis not present

## 2014-10-02 DIAGNOSIS — F411 Generalized anxiety disorder: Secondary | ICD-10-CM | POA: Diagnosis not present

## 2014-10-02 DIAGNOSIS — G894 Chronic pain syndrome: Secondary | ICD-10-CM

## 2014-10-02 NOTE — Progress Notes (Signed)
Patient ID: Kelly Irwin, female   DOB: 09-03-1927, 81 y.o.   MRN: [id number]   Location:  [facility]  Code Status: DNR  Goals of Care:Advanced Directive information Does patient have an advance directive?: Yes, Type of Advance Directive: [place];Living will;Out of facility DNR (pink MOST or yellow form), Pre-existing out of facility DNR order (yellow form or pink MOST form): Yellow form placed in chart (order not valid for inpatient use), Does patient want to make changes to advanced directive?: No - Patient declined  Chief Complaint  Patient presents with  . Medical Management of Chronic Issues    blood pressure, thyroid, anxiety    HPI: Patient is a 81 y.o. white female seen in the [facility] today for med mgt of chronic diseases.  Nursing notes reviewed.  Feels fine.   Aches and pains are the same despite reduced dose of methadone at the request of her daughter.   Says she gets a cramp of her left leg here and there.  Uses heating pad that drives it away.   Reviewed nursing notes.  Is having more difficulty with incontinence than she once did.  Wears a pad.  No fecal incontinence per pt.  We wanted to try myrbetriq but her daughter did not want this.    HTN;  bp well controlled with toprol.  Hypothyroidism:  TSH was a little low.    GAD:  Nerves are fair.  Worries about her husband sometimes.  Is on buspar and abilify, as well as ativan for her anxiety.  Continues also on zoloft.  Chronic constipation:  Moving well with miralax regimen small capful daily.  Still has her toes that overlap on both feet and are hammered.  Uses a protective moleskin on each to prevent ulceration.  Review of Systems:  Review of Systems  Constitutional: Negative for weight loss and malaise/fatigue.  HENT: Negative for congestion.   Eyes: Negative for blurred vision.       Glasses  Respiratory: Negative for shortness of breath.   Cardiovascular: Positive for  leg swelling. Negative for chest pain.  Gastrointestinal: Negative for abdominal pain, constipation, blood in stool and melena.       Bowels move with miralax  Genitourinary: Positive for urgency. Negative for dysuria, frequency and flank pain.       Urinary incontinence  Musculoskeletal: Negative for myalgias, back pain, joint pain and falls.  Skin: Positive for rash.       Under right breast  Neurological: Negative for dizziness, loss of consciousness, weakness and headaches.  Psychiatric/Behavioral: Negative for depression. The patient is nervous/anxious.     Past Medical History  Diagnosis Date  . Spinal stenosis, unspecified region other than cervical   . Pain in joint, shoulder region   . Unspecified essential hypertension   . Other and unspecified hyperlipidemia   . Acute upper respiratory infections of unspecified site 04/28/2012  . Edema 02/09/2012  . Other specified erythematous condition(695.89) 01/24/2012  . Lumbago 12/27/2011  . Rash and other nonspecific skin eruption 12/27/2011  . Unspecified hypothyroidism 11/10/2011  . Anxiety state, unspecified 11/10/2011  . Depressive disorder, not elsewhere classified 11/10/2011  . Reflux esophagitis 11/10/2011  . Unspecified constipation 11/10/2011  . Osteoarthrosis, unspecified whether generalized or localized, unspecified site 11/10/2011  . Backache, unspecified 11/10/2011  . Unspecified urinary incontinence 11/10/2011  . Memory loss 12/18/2010  . Unspecified hypothyroidism   . Abnormality of gait 11/15/2012    Past Surgical History  Procedure Laterality Date  . Back surgery      x3  . Rotator cuff repair Right   . Total abdominal hysterectomy w/ bilateral salpingoophorectomy  1972  . Cataract extraction w/ intraocular lens  implant, bilateral  2009-2010    Social History:   reports that she quit smoking about 66 years ago. She has never used smokeless tobacco. She reports that she drinks alcohol. She reports that she does not  use illicit drugs.  Allergies  Allergen Reactions  . Cozaar [Losartan Potassium] Swelling  . Doxycycline   . Lamictal [Lamotrigine]   . Nsaids   . Neurontin [Gabapentin]     Medications: Patient's Medications  New Prescriptions   No medications on file  Previous Medications   ARIPIPRAZOLE (ABILIFY) 2 MG TABLET    Take 2 mg by mouth daily. Take 1 tablet at bedtime for depression   BUSPIRONE (BUSPAR) 15 MG TABLET    Take one tablet twice daily   CALCIUM CARBONATE-VITAMIN D (CALCIUM 600+D3) 600-400 MG-UNIT PER TABLET    Take 1 tablet by mouth daily. Take one tablet  daily   CETAPHIL (CETAPHIL) LOTION    Apply 1 application topically. Apply twice a day as needed for dry, scaly skin   HYDROCORTISONE 1 % LOTION    Apply 1 application topically. To face at bedtime   KETOCONAZOLE (NIZORAL) 2 % SHAMPOO    Apply to scalp let sit 5 minutes then rinse once a week   LEVOTHYROXINE (SYNTHROID, LEVOTHROID) 75 MCG TABLET    TAKE ONE TABLET EVERY DAY   LORAZEPAM (ATIVAN) 0.5 MG TABLET    Take 0.5 mg by mouth. Take one tablet at bedtime   MELATONIN 3 MG CAPS    Take by mouth. Take one capsule daily   METHADONE (DOLOPHINE) 5 MG TABLET    Take 5 mg by mouth. Take one tablet twice daily, 1 1/2 tablets in evening for pain   METOPROLOL SUCCINATE (TOPROL-XL) 25 MG 24 HR TABLET    TAKE ONE TABLET EACH DAY FOR BLOOD PRESSURE   MULTIPLE VITAMINS-MINERALS (CARRAVITE PO)    Take by mouth. Take one tablet daily   OMEPRAZOLE (PRILOSEC) 20 MG CAPSULE    TAKE ONE CAPSULE TWICE A DAY TO REDUCE STOMACH ACID   POLYETHYLENE GLYCOL (MIRALAX / GLYCOLAX) PACKET    Take 17 g by mouth daily. For constipation. Hold for loose stool   PYRITHIONE ZINC (HEAD AND SHOULDERS) 1 % SHAMPOO    Apply topically. Wash face daily round hairline   SERTRALINE (ZOLOFT) 50 MG TABLET    TAKE ONE TABLET EACH DAY FOR DEPRESSION  Modified Medications   No medications on file  Discontinued Medications   METHADONE (DOLOPHINE) 5 MG TABLET    Take 1  tablet (5 mg total) by mouth as directed. Take 1 1/2 tablet (7.5mg ) three times daily  to treat chronic pain   TRIAMCINOLONE OINTMENT (KENALOG) 0.1 %    Apply 1 application topically. Apply a thin layer to left breast twice daily for 14 days     Physical Exam: Filed Vitals:   10/02/14 1417  BP: 122/64  Pulse: 68  Temp: 98 F (36.7 C)  TempSrc: Oral  Weight: 147 lb (66.679 kg)  SpO2: 96%   Body mass index is 30.73 kg/(m^2). Physical Exam  Constitutional: She is oriented to person, place, and time. She appears well-developed and well-nourished. No distress.  Walks slowly with rollator walker and could not get up and go without multiple attempts  Cardiovascular:  Normal rate, regular rhythm, normal heart sounds and intact distal pulses.   Pulmonary/Chest: Effort normal and breath sounds normal. No respiratory distress.  Abdominal: Soft. Bowel sounds are normal. She exhibits no distension. There is no tenderness.  Musculoskeletal: Normal range of motion.  Walking slowly with rollator walker  Neurological: She is alert and oriented to person, place, and time.  Skin: Skin is warm and dry.  Mild erythema beneath right breast--nystatin powder present  Psychiatric: She has a normal mood and affect. Her behavior is normal.     Labs reviewed: Basic Metabolic Panel:  Recent Labs  04/11/14  TSH 0.35*   Patient Care Team: Gayland Curry, DO as PCP - General (Geriatric Medicine) [facility] Jacolyn Reedy, MD as Consulting Physician (Cardiology) Sheralyn Boatman, MD as Consulting Physician (Psychiatry)  Assessment/Plan 1. Chronic pain syndrome -cont methadone 5mg  twice a day with 7.5mg  in the evening -recent reduction seems to have improved her alertness and she does not have any pain complaints today  2. Urge urinary incontinence -ongoing and gradually progressive -try to go on a schedule and avoid hydration in last 2-3 hrs before bed  3. Hypothyroidism,  unspecified hypothyroidism type -cont synthroid 66mcg daily, but reassess TSH in am due to low tsh in Jan  4. Generalized anxiety disorder -cont buspar, abilify, zoloft and ativan--has been doing very well from this perspective aside from worries about her husband so will not "rock the boat" by making changes  5. Candidal skin infection -cont nystatin powder for right breast which is effective -also continue loose bra not tight one   6. Acquired bilateral hammer toes -cont use of moleskin to prevent pressure/ulceration from developing and cont current shoewear   Labs/tests ordered: tsh; will then need full panel ordered after her physical in Sept Next appt:  Keep annual exam scheduled in Sept  Alana Dayton L. Reet Scharrer, D.O. [facility] [address], [place] [address] Cell Phone (Mon-Fri 8am-5pm):  [phone] On Call:  [phone] & follow prompts after 5pm & weekends Office Phone:  [phone] Office Fax:  [phone]

## 2014-10-03 DIAGNOSIS — E039 Hypothyroidism, unspecified: Secondary | ICD-10-CM | POA: Diagnosis not present

## 2014-10-08 DIAGNOSIS — F411 Generalized anxiety disorder: Secondary | ICD-10-CM | POA: Diagnosis not present

## 2014-10-08 DIAGNOSIS — F331 Major depressive disorder, recurrent, moderate: Secondary | ICD-10-CM | POA: Diagnosis not present

## 2014-10-15 DIAGNOSIS — F411 Generalized anxiety disorder: Secondary | ICD-10-CM | POA: Diagnosis not present

## 2014-10-15 DIAGNOSIS — F331 Major depressive disorder, recurrent, moderate: Secondary | ICD-10-CM | POA: Diagnosis not present

## 2014-10-22 DIAGNOSIS — F411 Generalized anxiety disorder: Secondary | ICD-10-CM | POA: Diagnosis not present

## 2014-10-22 DIAGNOSIS — F331 Major depressive disorder, recurrent, moderate: Secondary | ICD-10-CM | POA: Diagnosis not present

## 2014-10-29 DIAGNOSIS — F411 Generalized anxiety disorder: Secondary | ICD-10-CM | POA: Diagnosis not present

## 2014-10-29 DIAGNOSIS — F331 Major depressive disorder, recurrent, moderate: Secondary | ICD-10-CM | POA: Diagnosis not present

## 2014-11-05 ENCOUNTER — Other Ambulatory Visit: Payer: Self-pay | Admitting: Internal Medicine

## 2014-11-05 DIAGNOSIS — F411 Generalized anxiety disorder: Secondary | ICD-10-CM | POA: Diagnosis not present

## 2014-11-05 DIAGNOSIS — F331 Major depressive disorder, recurrent, moderate: Secondary | ICD-10-CM | POA: Diagnosis not present

## 2014-11-08 ENCOUNTER — Other Ambulatory Visit: Payer: Self-pay | Admitting: Internal Medicine

## 2014-11-12 DIAGNOSIS — F411 Generalized anxiety disorder: Secondary | ICD-10-CM | POA: Diagnosis not present

## 2014-11-12 DIAGNOSIS — F331 Major depressive disorder, recurrent, moderate: Secondary | ICD-10-CM | POA: Diagnosis not present

## 2014-11-14 DIAGNOSIS — E039 Hypothyroidism, unspecified: Secondary | ICD-10-CM | POA: Diagnosis not present

## 2014-11-14 LAB — TSH: TSH: 0.22 u[IU]/mL — AB (ref 0.41–5.90)

## 2014-11-19 DIAGNOSIS — F411 Generalized anxiety disorder: Secondary | ICD-10-CM | POA: Diagnosis not present

## 2014-11-19 DIAGNOSIS — F331 Major depressive disorder, recurrent, moderate: Secondary | ICD-10-CM | POA: Diagnosis not present

## 2014-12-03 ENCOUNTER — Other Ambulatory Visit: Payer: Self-pay

## 2014-12-03 DIAGNOSIS — F331 Major depressive disorder, recurrent, moderate: Secondary | ICD-10-CM | POA: Diagnosis not present

## 2014-12-03 DIAGNOSIS — F411 Generalized anxiety disorder: Secondary | ICD-10-CM | POA: Diagnosis not present

## 2014-12-03 MED ORDER — METHADONE HCL 5 MG PO TABS: ORAL_TABLET | ORAL | Status: DC

## 2014-12-04 ENCOUNTER — Non-Acute Institutional Stay (INDEPENDENT_AMBULATORY_CARE_PROVIDER_SITE_OTHER): Payer: Medicare Other | Admitting: Internal Medicine

## 2014-12-04 ENCOUNTER — Other Ambulatory Visit: Payer: Self-pay | Admitting: Internal Medicine

## 2014-12-04 ENCOUNTER — Encounter: Payer: Self-pay | Admitting: Internal Medicine

## 2014-12-04 VITALS — BP 134/68 | HR 60 | Temp 98.2°F | Ht <= 58 in | Wt 150.0 lb

## 2014-12-04 DIAGNOSIS — M810 Age-related osteoporosis without current pathological fracture: Secondary | ICD-10-CM | POA: Diagnosis not present

## 2014-12-04 DIAGNOSIS — G894 Chronic pain syndrome: Secondary | ICD-10-CM

## 2014-12-04 DIAGNOSIS — K5900 Constipation, unspecified: Secondary | ICD-10-CM

## 2014-12-04 DIAGNOSIS — Z9181 History of falling: Secondary | ICD-10-CM

## 2014-12-04 DIAGNOSIS — B372 Candidiasis of skin and nail: Secondary | ICD-10-CM

## 2014-12-04 DIAGNOSIS — R269 Unspecified abnormalities of gait and mobility: Secondary | ICD-10-CM

## 2014-12-04 DIAGNOSIS — E039 Hypothyroidism, unspecified: Secondary | ICD-10-CM | POA: Diagnosis not present

## 2014-12-04 DIAGNOSIS — E2839 Other primary ovarian failure: Secondary | ICD-10-CM

## 2014-12-04 DIAGNOSIS — N3941 Urge incontinence: Secondary | ICD-10-CM

## 2014-12-04 DIAGNOSIS — L219 Seborrheic dermatitis, unspecified: Secondary | ICD-10-CM | POA: Diagnosis not present

## 2014-12-04 DIAGNOSIS — F411 Generalized anxiety disorder: Secondary | ICD-10-CM | POA: Diagnosis not present
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
## 2014-12-04 NOTE — Progress Notes (Signed)
Patient ID: Kelly Irwin, female   DOB: [birth year], 81 y.o.   MRN: [id number] Annual wellness exam portion: Depression screen [facility] 2/9 12/04/2014 11/28/2013 11/15/2012  Decreased Interest 0 0 1  Down, Depressed, Hopeless 3 0 0  PHQ - 2 Score 3 0 1  Altered sleeping 3 - -  Tired, decreased energy 3 - -  Change in appetite 2 - -  Feeling bad or failure about yourself  0 - -  Trouble concentrating 3 - -  Moving slowly or fidgety/restless 0 - -  Suicidal thoughts 0 - -  PHQ-9 Score 14 - -  Difficult doing work/chores Somewhat difficult - -  Moderate depression on PHQ9.  Fall Risk  12/04/2014 11/28/2013 11/15/2012  Falls in the past year? Yes No No  Number falls in past yr: 1 - -  Injury with Fall? Yes - -  Risk Factor Category  High Fall Risk - -  High fall risk due to meds, balance, constipation.  MMSE - Mini Mental State Exam 11/15/2012  Orientation to time 3  Orientation to Place 5  Registration 3  Attention/ Calculation 5  Recall 2  Language- name 2 objects 2  Language- repeat 1  Language- follow 3 step command 3  Language- read & follow direction 1  Write a sentence 1  Copy design 0  Total score 26  09/04/14 MMSE was 28/30, failed clock  Functional status:  Staff provide her meds. She lives in assisted living.  Needs some help with bathing, dressing, grooming due to fall risk, weakness.  Vision:  Follows with ophthalmology but I don't see any of the recent visits in her chart.    Podiatry: Last note on this in her chart is from 01/25/13 when she was seen at Dr. Prudence Davidson on [facility].    Dental:  No notes on this in the chart either.    Immunization History  Administered Date(s) Administered  . Influenza Whole 12/14/2011, 12/26/2012  . Influenza-Unspecified 12/18/2013  . PPD Test 09/10/2011  . Pneumococcal Conjugate-13 04/09/2014  . Pneumococcal Polysaccharide-23 12/13/2011   Should have a bone density if her daughter is agreeable and staff or family can accompany her to the  appt at the imaging center.  tdap also is not showing as being given.  Will need staff to check with Dr. Dillard Essex on this also.

## 2014-12-05 ENCOUNTER — Other Ambulatory Visit: Payer: Self-pay

## 2014-12-05 ENCOUNTER — Other Ambulatory Visit: Payer: Self-pay | Admitting: Internal Medicine

## 2014-12-05 DIAGNOSIS — E2839 Other primary ovarian failure: Secondary | ICD-10-CM

## 2014-12-06 ENCOUNTER — Other Ambulatory Visit: Payer: Self-pay

## 2014-12-10 ENCOUNTER — Other Ambulatory Visit: Payer: Self-pay | Admitting: *Deleted

## 2014-12-10 DIAGNOSIS — F411 Generalized anxiety disorder: Secondary | ICD-10-CM | POA: Diagnosis not present

## 2014-12-10 DIAGNOSIS — F331 Major depressive disorder, recurrent, moderate: Secondary | ICD-10-CM | POA: Diagnosis not present

## 2014-12-10 MED ORDER — LEVOTHYROXINE SODIUM 75 MCG PO TABS: ORAL_TABLET | ORAL | Status: DC

## 2014-12-10 NOTE — Telephone Encounter (Signed)
Kelly Irwin 

## 2014-12-17 DIAGNOSIS — F411 Generalized anxiety disorder: Secondary | ICD-10-CM | POA: Diagnosis not present

## 2014-12-17 DIAGNOSIS — F331 Major depressive disorder, recurrent, moderate: Secondary | ICD-10-CM | POA: Diagnosis not present

## 2014-12-18 DIAGNOSIS — Z23 Encounter for immunization: Secondary | ICD-10-CM | POA: Diagnosis not present

## 2014-12-24 DIAGNOSIS — F331 Major depressive disorder, recurrent, moderate: Secondary | ICD-10-CM | POA: Diagnosis not present

## 2014-12-24 DIAGNOSIS — F411 Generalized anxiety disorder: Secondary | ICD-10-CM | POA: Diagnosis not present

## 2014-12-26 DIAGNOSIS — R799 Abnormal finding of blood chemistry, unspecified: Secondary | ICD-10-CM | POA: Diagnosis not present

## 2014-12-26 DIAGNOSIS — E785 Hyperlipidemia, unspecified: Secondary | ICD-10-CM | POA: Diagnosis not present

## 2014-12-26 DIAGNOSIS — E039 Hypothyroidism, unspecified: Secondary | ICD-10-CM | POA: Diagnosis not present

## 2014-12-26 LAB — TSH: TSH: 0.4 u[IU]/mL — AB (ref 0.41–5.90)

## 2014-12-26 LAB — HEPATIC FUNCTION PANEL
ALK PHOS: 58 U/L (ref 25–125)
ALT: 11 U/L (ref 7–35)
AST: 17 U/L (ref 13–35)
Bilirubin, Total: 0.4 mg/dL

## 2014-12-26 LAB — BASIC METABOLIC PANEL
BUN: 15 mg/dL (ref 4–21)
Creatinine: 1 mg/dL (ref 0.5–1.1)
Glucose: 93 mg/dL
Potassium: 4.4 mmol/L (ref 3.4–5.3)
Sodium: 135 mmol/L — AB (ref 137–147)

## 2014-12-26 LAB — CBC AND DIFFERENTIAL
HCT: 35 % — AB (ref 36–46)
Hemoglobin: 11.7 g/dL — AB (ref 12.0–16.0)
Platelets: 199 10*3/uL (ref 150–399)
WBC: 7.2 10*3/mL

## 2014-12-26 LAB — LIPID PANEL
CHOLESTEROL: 227 mg/dL — AB (ref 0–200)
HDL: 24 mg/dL — AB (ref 35–70)
LDL CALC: 164 mg/dL
LDl/HDL Ratio: 6.8
TRIGLYCERIDES: 181 mg/dL — AB (ref 40–160)

## 2014-12-26 LAB — HEMOGLOBIN A1C: HEMOGLOBIN A1C: 6.2

## 2015-01-01 DIAGNOSIS — R2689 Other abnormalities of gait and mobility: Secondary | ICD-10-CM | POA: Diagnosis not present

## 2015-01-01 DIAGNOSIS — M4807 Spinal stenosis, lumbosacral region: Secondary | ICD-10-CM | POA: Diagnosis not present

## 2015-01-01 DIAGNOSIS — M4806 Spinal stenosis, lumbar region: Secondary | ICD-10-CM | POA: Diagnosis not present

## 2015-01-01 DIAGNOSIS — R278 Other lack of coordination: Secondary | ICD-10-CM | POA: Diagnosis not present

## 2015-01-01 DIAGNOSIS — Z9181 History of falling: Secondary | ICD-10-CM | POA: Diagnosis not present

## 2015-01-03 DIAGNOSIS — R278 Other lack of coordination: Secondary | ICD-10-CM | POA: Diagnosis not present

## 2015-01-03 DIAGNOSIS — R2689 Other abnormalities of gait and mobility: Secondary | ICD-10-CM | POA: Diagnosis not present

## 2015-01-03 DIAGNOSIS — M4807 Spinal stenosis, lumbosacral region: Secondary | ICD-10-CM | POA: Diagnosis not present

## 2015-01-03 DIAGNOSIS — Z9181 History of falling: Secondary | ICD-10-CM | POA: Diagnosis not present

## 2015-01-03 DIAGNOSIS — M4806 Spinal stenosis, lumbar region: Secondary | ICD-10-CM | POA: Diagnosis not present

## 2015-01-06 DIAGNOSIS — R278 Other lack of coordination: Secondary | ICD-10-CM | POA: Diagnosis not present

## 2015-01-06 DIAGNOSIS — R2689 Other abnormalities of gait and mobility: Secondary | ICD-10-CM | POA: Diagnosis not present

## 2015-01-06 DIAGNOSIS — M4807 Spinal stenosis, lumbosacral region: Secondary | ICD-10-CM | POA: Diagnosis not present

## 2015-01-06 DIAGNOSIS — M4806 Spinal stenosis, lumbar region: Secondary | ICD-10-CM | POA: Diagnosis not present

## 2015-01-06 DIAGNOSIS — Z9181 History of falling: Secondary | ICD-10-CM | POA: Diagnosis not present

## 2015-01-07 DIAGNOSIS — F331 Major depressive disorder, recurrent, moderate: Secondary | ICD-10-CM | POA: Diagnosis not present

## 2015-01-07 DIAGNOSIS — F411 Generalized anxiety disorder: Secondary | ICD-10-CM | POA: Diagnosis not present

## 2015-01-08 DIAGNOSIS — R2689 Other abnormalities of gait and mobility: Secondary | ICD-10-CM | POA: Diagnosis not present

## 2015-01-08 DIAGNOSIS — M4806 Spinal stenosis, lumbar region: Secondary | ICD-10-CM | POA: Diagnosis not present

## 2015-01-08 DIAGNOSIS — M4807 Spinal stenosis, lumbosacral region: Secondary | ICD-10-CM | POA: Diagnosis not present

## 2015-01-08 DIAGNOSIS — R278 Other lack of coordination: Secondary | ICD-10-CM | POA: Diagnosis not present

## 2015-01-08 DIAGNOSIS — Z9181 History of falling: Secondary | ICD-10-CM | POA: Diagnosis not present

## 2015-01-10 DIAGNOSIS — R2689 Other abnormalities of gait and mobility: Secondary | ICD-10-CM | POA: Diagnosis not present

## 2015-01-10 DIAGNOSIS — Z9181 History of falling: Secondary | ICD-10-CM | POA: Diagnosis not present

## 2015-01-10 DIAGNOSIS — R278 Other lack of coordination: Secondary | ICD-10-CM | POA: Diagnosis not present

## 2015-01-10 DIAGNOSIS — M4807 Spinal stenosis, lumbosacral region: Secondary | ICD-10-CM | POA: Diagnosis not present

## 2015-01-10 DIAGNOSIS — M4806 Spinal stenosis, lumbar region: Secondary | ICD-10-CM | POA: Diagnosis not present

## 2015-01-13 ENCOUNTER — Other Ambulatory Visit: Payer: Self-pay | Admitting: *Deleted

## 2015-01-13 DIAGNOSIS — Z9181 History of falling: Secondary | ICD-10-CM | POA: Diagnosis not present

## 2015-01-13 DIAGNOSIS — R2689 Other abnormalities of gait and mobility: Secondary | ICD-10-CM | POA: Diagnosis not present

## 2015-01-13 DIAGNOSIS — M4807 Spinal stenosis, lumbosacral region: Secondary | ICD-10-CM | POA: Diagnosis not present

## 2015-01-13 DIAGNOSIS — M4806 Spinal stenosis, lumbar region: Secondary | ICD-10-CM | POA: Diagnosis not present

## 2015-01-13 DIAGNOSIS — R278 Other lack of coordination: Secondary | ICD-10-CM | POA: Diagnosis not present

## 2015-01-13 MED ORDER — OMEPRAZOLE 20 MG PO CPDR: DELAYED_RELEASE_CAPSULE | ORAL | Status: DC

## 2015-01-13 NOTE — Telephone Encounter (Signed)
[facility] 

## 2015-01-14 DIAGNOSIS — F331 Major depressive disorder, recurrent, moderate: Secondary | ICD-10-CM | POA: Diagnosis not present

## 2015-01-14 DIAGNOSIS — F411 Generalized anxiety disorder: Secondary | ICD-10-CM | POA: Diagnosis not present

## 2015-01-15 DIAGNOSIS — M4806 Spinal stenosis, lumbar region: Secondary | ICD-10-CM | POA: Diagnosis not present

## 2015-01-15 DIAGNOSIS — R278 Other lack of coordination: Secondary | ICD-10-CM | POA: Diagnosis not present

## 2015-01-15 DIAGNOSIS — R2689 Other abnormalities of gait and mobility: Secondary | ICD-10-CM | POA: Diagnosis not present

## 2015-01-15 DIAGNOSIS — M4807 Spinal stenosis, lumbosacral region: Secondary | ICD-10-CM | POA: Diagnosis not present

## 2015-01-15 DIAGNOSIS — Z9181 History of falling: Secondary | ICD-10-CM | POA: Diagnosis not present

## 2015-01-17 DIAGNOSIS — M4807 Spinal stenosis, lumbosacral region: Secondary | ICD-10-CM | POA: Diagnosis not present

## 2015-01-17 DIAGNOSIS — Z9181 History of falling: Secondary | ICD-10-CM | POA: Diagnosis not present

## 2015-01-17 DIAGNOSIS — M4806 Spinal stenosis, lumbar region: Secondary | ICD-10-CM | POA: Diagnosis not present

## 2015-01-17 DIAGNOSIS — R2689 Other abnormalities of gait and mobility: Secondary | ICD-10-CM | POA: Diagnosis not present

## 2015-01-17 DIAGNOSIS — R278 Other lack of coordination: Secondary | ICD-10-CM | POA: Diagnosis not present

## 2015-01-20 DIAGNOSIS — Z9181 History of falling: Secondary | ICD-10-CM | POA: Diagnosis not present

## 2015-01-20 DIAGNOSIS — M4807 Spinal stenosis, lumbosacral region: Secondary | ICD-10-CM | POA: Diagnosis not present

## 2015-01-20 DIAGNOSIS — R2689 Other abnormalities of gait and mobility: Secondary | ICD-10-CM | POA: Diagnosis not present

## 2015-01-20 DIAGNOSIS — R278 Other lack of coordination: Secondary | ICD-10-CM | POA: Diagnosis not present

## 2015-01-20 DIAGNOSIS — M4806 Spinal stenosis, lumbar region: Secondary | ICD-10-CM | POA: Diagnosis not present

## 2015-01-21 DIAGNOSIS — F411 Generalized anxiety disorder: Secondary | ICD-10-CM | POA: Diagnosis not present

## 2015-01-21 DIAGNOSIS — F331 Major depressive disorder, recurrent, moderate: Secondary | ICD-10-CM | POA: Diagnosis not present

## 2015-01-23 DIAGNOSIS — M4806 Spinal stenosis, lumbar region: Secondary | ICD-10-CM | POA: Diagnosis not present

## 2015-01-23 DIAGNOSIS — R2689 Other abnormalities of gait and mobility: Secondary | ICD-10-CM | POA: Diagnosis not present

## 2015-01-23 DIAGNOSIS — M4807 Spinal stenosis, lumbosacral region: Secondary | ICD-10-CM | POA: Diagnosis not present

## 2015-01-23 DIAGNOSIS — N3942 Incontinence without sensory awareness: Secondary | ICD-10-CM | POA: Diagnosis not present

## 2015-01-23 DIAGNOSIS — R278 Other lack of coordination: Secondary | ICD-10-CM | POA: Diagnosis not present

## 2015-01-23 DIAGNOSIS — Z9181 History of falling: Secondary | ICD-10-CM | POA: Diagnosis not present

## 2015-01-24 DIAGNOSIS — Z9181 History of falling: Secondary | ICD-10-CM | POA: Diagnosis not present

## 2015-01-24 DIAGNOSIS — R278 Other lack of coordination: Secondary | ICD-10-CM | POA: Diagnosis not present

## 2015-01-24 DIAGNOSIS — R2689 Other abnormalities of gait and mobility: Secondary | ICD-10-CM | POA: Diagnosis not present

## 2015-01-24 DIAGNOSIS — M4806 Spinal stenosis, lumbar region: Secondary | ICD-10-CM | POA: Diagnosis not present

## 2015-01-24 DIAGNOSIS — M4807 Spinal stenosis, lumbosacral region: Secondary | ICD-10-CM | POA: Diagnosis not present

## 2015-01-27 DIAGNOSIS — M4806 Spinal stenosis, lumbar region: Secondary | ICD-10-CM | POA: Diagnosis not present

## 2015-01-27 DIAGNOSIS — R2689 Other abnormalities of gait and mobility: Secondary | ICD-10-CM | POA: Diagnosis not present

## 2015-01-27 DIAGNOSIS — M4807 Spinal stenosis, lumbosacral region: Secondary | ICD-10-CM | POA: Diagnosis not present

## 2015-01-27 DIAGNOSIS — R278 Other lack of coordination: Secondary | ICD-10-CM | POA: Diagnosis not present

## 2015-01-27 DIAGNOSIS — Z9181 History of falling: Secondary | ICD-10-CM | POA: Diagnosis not present

## 2015-01-28 DIAGNOSIS — F331 Major depressive disorder, recurrent, moderate: Secondary | ICD-10-CM | POA: Diagnosis not present

## 2015-01-28 DIAGNOSIS — F411 Generalized anxiety disorder: Secondary | ICD-10-CM | POA: Diagnosis not present

## 2015-01-29 DIAGNOSIS — R278 Other lack of coordination: Secondary | ICD-10-CM | POA: Diagnosis not present

## 2015-01-29 DIAGNOSIS — Z9181 History of falling: Secondary | ICD-10-CM | POA: Diagnosis not present

## 2015-01-29 DIAGNOSIS — H52203 Unspecified astigmatism, bilateral: Secondary | ICD-10-CM | POA: Diagnosis not present

## 2015-01-29 DIAGNOSIS — Z961 Presence of intraocular lens: Secondary | ICD-10-CM | POA: Diagnosis not present

## 2015-01-29 DIAGNOSIS — M4806 Spinal stenosis, lumbar region: Secondary | ICD-10-CM | POA: Diagnosis not present

## 2015-01-29 DIAGNOSIS — M4807 Spinal stenosis, lumbosacral region: Secondary | ICD-10-CM | POA: Diagnosis not present

## 2015-01-29 DIAGNOSIS — R2689 Other abnormalities of gait and mobility: Secondary | ICD-10-CM | POA: Diagnosis not present

## 2015-02-04 DIAGNOSIS — F411 Generalized anxiety disorder: Secondary | ICD-10-CM | POA: Diagnosis not present

## 2015-02-04 DIAGNOSIS — F331 Major depressive disorder, recurrent, moderate: Secondary | ICD-10-CM | POA: Diagnosis not present

## 2015-02-10 ENCOUNTER — Other Ambulatory Visit: Payer: Self-pay | Admitting: *Deleted

## 2015-02-10 MED ORDER — METOPROLOL SUCCINATE ER 25 MG PO TB24: ORAL_TABLET | ORAL | Status: DC

## 2015-02-10 NOTE — Telephone Encounter (Signed)
[facility] 

## 2015-02-11 DIAGNOSIS — F331 Major depressive disorder, recurrent, moderate: Secondary | ICD-10-CM | POA: Diagnosis not present

## 2015-02-11 DIAGNOSIS — F411 Generalized anxiety disorder: Secondary | ICD-10-CM | POA: Diagnosis not present

## 2015-02-18 DIAGNOSIS — F411 Generalized anxiety disorder: Secondary | ICD-10-CM | POA: Diagnosis not present

## 2015-02-18 DIAGNOSIS — F331 Major depressive disorder, recurrent, moderate: Secondary | ICD-10-CM | POA: Diagnosis not present

## 2015-02-25 DIAGNOSIS — F411 Generalized anxiety disorder: Secondary | ICD-10-CM | POA: Diagnosis not present

## 2015-02-25 DIAGNOSIS — F331 Major depressive disorder, recurrent, moderate: Secondary | ICD-10-CM | POA: Diagnosis not present

## 2015-03-12 ENCOUNTER — Encounter: Payer: Medicare Other | Admitting: Internal Medicine

## 2015-03-18 DIAGNOSIS — F411 Generalized anxiety disorder: Secondary | ICD-10-CM | POA: Diagnosis not present

## 2015-03-18 DIAGNOSIS — F331 Major depressive disorder, recurrent, moderate: Secondary | ICD-10-CM | POA: Diagnosis not present

## 2015-03-26 ENCOUNTER — Encounter: Payer: Self-pay | Admitting: Internal Medicine

## 2015-03-26 ENCOUNTER — Non-Acute Institutional Stay: Payer: Medicare Other | Admitting: Internal Medicine

## 2015-03-26 VITALS — BP 120/72 | HR 74 | Temp 97.9°F | Ht <= 58 in | Wt 149.0 lb

## 2015-03-26 DIAGNOSIS — F411 Generalized anxiety disorder: Secondary | ICD-10-CM

## 2015-03-26 DIAGNOSIS — G894 Chronic pain syndrome: Secondary | ICD-10-CM | POA: Diagnosis not present

## 2015-03-26 DIAGNOSIS — K5900 Constipation, unspecified: Secondary | ICD-10-CM

## 2015-03-26 DIAGNOSIS — N3941 Urge incontinence: Secondary | ICD-10-CM

## 2015-03-26 DIAGNOSIS — M25552 Pain in left hip: Secondary | ICD-10-CM | POA: Diagnosis not present

## 2015-03-26 DIAGNOSIS — E039 Hypothyroidism, unspecified: Secondary | ICD-10-CM

## 2015-03-26 NOTE — Progress Notes (Signed)
Patient ID: Kelly Irwin, female   DOB: 1927/08/08, 82 y.o.   MRN: [id number]   Location:  [facility]  Code Status: DNR  Goals of Care:Advanced Directive information Does patient have an advance directive?: Yes, Type of Advance Directive: [place];Out of facility DNR (pink MOST or yellow form), Pre-existing out of facility DNR order (yellow form or pink MOST form): Yellow form placed in chart (order not valid for inpatient use)  Chief Complaint  Patient presents with  . Medical Management of Chronic Issues    thyroid, blood pressure, chronic pain, anxiety. Here with husband    HPI: Patient is a 16 y.o. white female seen in the [facility] today for med mgt of chronic diseases.  Her pain medication was increased due to left hip pain within the last week.  She had an extra dose of methadone on 03/14/15 due to this pain.  She reports that it comes and goes.  Today not bad b/c it's a little warmer out.  When really cold and damp, it was very painful.  Not necessarily worse with walking.  No pain into her back.    We received her ophtho and dental records now.  Asked CMA to abstract from paper chart.  Urge incontinence:  Still getting up frequently at night--2-3 times per night now.  Someone helps her during the night which helps the situation.    Constipation:  Takes her miralax as needed if she gets constipated.    No falls lately. Uses her trusty walker.   Depression:  Spirits ok.  Is happy in [place].  Walked all the way over here and plans to walk back.  No difficulty getting short of breath.    Review of Systems:  Review of Systems  Constitutional: Negative for fever and chills.  Eyes: Negative for blurred vision.  Respiratory: Negative for shortness of breath.   Cardiovascular: Negative for chest pain.  Gastrointestinal: Positive for constipation. Negative for abdominal pain.  Genitourinary: Positive for urgency and frequency. Negative for dysuria.    Musculoskeletal: Positive for joint pain. Negative for falls.  Neurological: Negative for dizziness.  Psychiatric/Behavioral: Positive for depression and memory loss.    Past Medical History  Diagnosis Date  . Spinal stenosis, unspecified region other than cervical   . Pain in joint, shoulder region   . Unspecified essential hypertension   . Other and unspecified hyperlipidemia   . Acute upper respiratory infections of unspecified site 04/28/2012  . Edema 02/09/2012  . Other specified erythematous condition(695.89) 01/24/2012  . Lumbago 12/27/2011  . Rash and other nonspecific skin eruption 12/27/2011  . Unspecified hypothyroidism 11/10/2011  . Anxiety state, unspecified 11/10/2011  . Depressive disorder, not elsewhere classified 11/10/2011  . Reflux esophagitis 11/10/2011  . Unspecified constipation 11/10/2011  . Osteoarthrosis, unspecified whether generalized or localized, unspecified site 11/10/2011  . Backache, unspecified 11/10/2011  . Unspecified urinary incontinence 11/10/2011  . Memory loss 12/18/2010  . Unspecified hypothyroidism   . Abnormality of gait 11/15/2012    Past Surgical History  Procedure Laterality Date  . Back surgery      x3  . Rotator cuff repair Right   . Total abdominal hysterectomy w/ bilateral salpingoophorectomy  1972  . Cataract extraction w/ intraocular lens  implant, bilateral  2009-2010    Social History:   reports that she quit smoking about 66 years ago. She has never used smokeless tobacco. She reports that she drinks alcohol. She reports that she does not  use illicit drugs.  Allergies  Allergen Reactions  . Cozaar [Losartan Potassium] Swelling  . Doxycycline   . Lamictal [Lamotrigine]   . Nsaids   . Neurontin [Gabapentin]     Medications: Patient's Medications  New Prescriptions   No medications on file  Previous Medications   ALPRAZOLAM (XANAX) 0.25 MG TABLET    Take 0.25 mg by mouth. Take one at bedtime   ARIPIPRAZOLE (ABILIFY) 2 MG  TABLET    Take 2 mg by mouth daily. Take 1 tablet at bedtime for depression   BUSPIRONE (BUSPAR) 15 MG TABLET    Take one tablet three times  daily   CALCIUM CARBONATE-VITAMIN D (CALCIUM 600+D3) 600-400 MG-UNIT PER TABLET    Take 1 tablet by mouth daily. Take one tablet  daily   CETAPHIL (CETAPHIL) LOTION    Apply 1 application topically. Apply twice a day as needed for dry, scaly skin   HYDROCORTISONE 1 % LOTION    Apply 1 application topically. To face at bedtime   KETOCONAZOLE (NIZORAL) 2 % SHAMPOO    Apply to scalp let sit 5 minutes then rinse once a week   LEVOTHYROXINE (SYNTHROID) 75 MCG TABLET    Take 1/2 tablet in morning 30 minutes before breakfast for thyroid   MELATONIN 3 MG CAPS    Take by mouth. Take one capsule daily   METHADONE (DOLOPHINE) 5 MG TABLET    Take one tablet at 6:00am and 2:00pm. Take 1 1/2 tablet (7.5mg ) at 6:00pm   METOPROLOL SUCCINATE (TOPROL-XL) 25 MG 24 HR TABLET    Take one tablet by mouth once daily for blood pressure   MULTIPLE VITAMINS-MINERALS (CARRAVITE PO)    Take by mouth. Take one tablet daily   OMEPRAZOLE (PRILOSEC) 20 MG CAPSULE    Take one capsule by mouth twice daily to reduce stomach acid   POLYETHYLENE GLYCOL (MIRALAX / GLYCOLAX) PACKET    Take 17 g by mouth daily. For constipation. Hold for loose stool   PYRITHIONE ZINC (HEAD AND SHOULDERS) 1 % SHAMPOO    Apply topically. Wash face daily round hairline   SERTRALINE (ZOLOFT) 50 MG TABLET    TAKE ONE TABLET EACH DAY FOR DEPRESSION  Modified Medications   No medications on file  Discontinued Medications   No medications on file     Physical Exam: Filed Vitals:   03/26/15 1407  BP: 120/72  Pulse: 74  Temp: 97.9 F (36.6 C)  TempSrc: Oral  Height: 4\' 10"  (1.473 m)  Weight: 149 lb (67.586 kg)  SpO2: 90%   Body mass index is 31.15 kg/(m^2). Physical Exam  Constitutional: She appears well-developed and well-nourished.  Cardiovascular: Normal rate, regular rhythm, normal heart sounds and  intact distal pulses.   Pulmonary/Chest: Effort normal and breath sounds normal. No respiratory distress.  Abdominal: Soft. Bowel sounds are normal. She exhibits no distension. There is no tenderness.  Musculoskeletal: Normal range of motion. She exhibits tenderness.  Left lateral hip mildly tender and also in sacroiliac joint  Neurological: She is alert.  Skin: Skin is warm and dry.  Psychiatric: She has a normal mood and affect.     Labs reviewed: Basic Metabolic Panel:  Recent Labs  04/11/14 11/14/14 12/26/14  NA  --   --  135*  K  --   --  4.4  BUN  --   --  15  CREATININE  --   --  1.0  TSH 0.35* 0.22* 0.40*   Liver Function Tests:  Recent  Labs  12/26/14  AST 17  ALT 11  ALKPHOS 58   No results for input(s): LIPASE, AMYLASE in the last 8760 hours. No results for input(s): AMMONIA in the last 8760 hours. CBC:  Recent Labs  12/26/14  WBC 7.2  HGB 11.7*  HCT 35*  PLT 199   Lipid Panel:  Recent Labs  12/26/14  CHOL 227*  HDL 24*  LDLCALC 164  TRIG 181*   Lab Results  Component Value Date   HGBA1C 6.2 12/26/2014    Patient Care Team: Gayland Curry, DO as PCP - General (Geriatric Medicine) [facility] Jacolyn Reedy, MD as Consulting Physician (Cardiology) Sheralyn Boatman, MD as Consulting Physician (Psychiatry)  Assessment/Plan 1. Acute hip pain, left -improved with increase in methadone, cont same, mentation does not seem affected by this  2. Urge urinary incontinence -ongoing, frequency at night, opted not to use myrbetriq, cont to monitor, avoid late night beverages before bed  3. Constipation, unspecified constipation type -cont current regimen, due to inactivity, pain medications, aging process  4. Hypothyroidism, unspecified hypothyroidism type -cont current synthroid, f/u lab before next visit, taking properly first thing in am  5. Chronic pain syndrome -required recent adjustment to methadone with her increased  left hip pain, but pain now under control  6. Generalized anxiety disorder -sees outside psychiatry--on xanax, buspar, then melatonin for sleep -seems to be doing just fine even with these [place] list meds  Labs/tests ordered:  Cbc, cmp, tsh before  Next appt:  4 mos   Kailo Kosik L. Shameika Speelman, D.O. [facility] [address], [place] [address] Cell Phone (Mon-Fri 8am-5pm):  [phone] On Call:  [phone] & follow prompts after 5pm & weekends Office Phone:  [phone] Office Fax:  [phone]

## 2015-04-01 DIAGNOSIS — F331 Major depressive disorder, recurrent, moderate: Secondary | ICD-10-CM | POA: Diagnosis not present

## 2015-04-01 DIAGNOSIS — F411 Generalized anxiety disorder: Secondary | ICD-10-CM | POA: Diagnosis not present

## 2015-04-08 DIAGNOSIS — F331 Major depressive disorder, recurrent, moderate: Secondary | ICD-10-CM | POA: Diagnosis not present

## 2015-04-08 DIAGNOSIS — F411 Generalized anxiety disorder: Secondary | ICD-10-CM | POA: Diagnosis not present

## 2015-04-15 DIAGNOSIS — F331 Major depressive disorder, recurrent, moderate: Secondary | ICD-10-CM | POA: Diagnosis not present

## 2015-04-15 DIAGNOSIS — F411 Generalized anxiety disorder: Secondary | ICD-10-CM | POA: Diagnosis not present

## 2015-04-17 ENCOUNTER — Other Ambulatory Visit: Payer: Self-pay | Admitting: *Deleted

## 2015-04-17 MED ORDER — LEVOTHYROXINE SODIUM 75 MCG PO TABS: ORAL_TABLET | ORAL | Status: DC

## 2015-04-22 DIAGNOSIS — F411 Generalized anxiety disorder: Secondary | ICD-10-CM | POA: Diagnosis not present

## 2015-04-22 DIAGNOSIS — F331 Major depressive disorder, recurrent, moderate: Secondary | ICD-10-CM | POA: Diagnosis not present

## 2015-04-29 DIAGNOSIS — F411 Generalized anxiety disorder: Secondary | ICD-10-CM | POA: Diagnosis not present

## 2015-04-29 DIAGNOSIS — F331 Major depressive disorder, recurrent, moderate: Secondary | ICD-10-CM | POA: Diagnosis not present

## 2015-05-02 DIAGNOSIS — M13842 Other specified arthritis, left hand: Secondary | ICD-10-CM | POA: Diagnosis not present

## 2015-05-02 DIAGNOSIS — M13841 Other specified arthritis, right hand: Secondary | ICD-10-CM | POA: Diagnosis not present

## 2015-05-02 DIAGNOSIS — R278 Other lack of coordination: Secondary | ICD-10-CM | POA: Diagnosis not present

## 2015-05-02 DIAGNOSIS — M6281 Muscle weakness (generalized): Secondary | ICD-10-CM | POA: Diagnosis not present

## 2015-05-05 DIAGNOSIS — M13841 Other specified arthritis, right hand: Secondary | ICD-10-CM | POA: Diagnosis not present

## 2015-05-05 DIAGNOSIS — M6281 Muscle weakness (generalized): Secondary | ICD-10-CM | POA: Diagnosis not present

## 2015-05-05 DIAGNOSIS — M13842 Other specified arthritis, left hand: Secondary | ICD-10-CM | POA: Diagnosis not present

## 2015-05-05 DIAGNOSIS — R278 Other lack of coordination: Secondary | ICD-10-CM | POA: Diagnosis not present

## 2015-05-06 DIAGNOSIS — F411 Generalized anxiety disorder: Secondary | ICD-10-CM | POA: Diagnosis not present

## 2015-05-06 DIAGNOSIS — F331 Major depressive disorder, recurrent, moderate: Secondary | ICD-10-CM | POA: Diagnosis not present

## 2015-05-07 DIAGNOSIS — M13842 Other specified arthritis, left hand: Secondary | ICD-10-CM | POA: Diagnosis not present

## 2015-05-07 DIAGNOSIS — M13841 Other specified arthritis, right hand: Secondary | ICD-10-CM | POA: Diagnosis not present

## 2015-05-07 DIAGNOSIS — R278 Other lack of coordination: Secondary | ICD-10-CM | POA: Diagnosis not present

## 2015-05-07 DIAGNOSIS — M6281 Muscle weakness (generalized): Secondary | ICD-10-CM | POA: Diagnosis not present

## 2015-05-12 ENCOUNTER — Other Ambulatory Visit: Payer: Self-pay | Admitting: Internal Medicine

## 2015-05-12 DIAGNOSIS — M6281 Muscle weakness (generalized): Secondary | ICD-10-CM | POA: Diagnosis not present

## 2015-05-12 DIAGNOSIS — R278 Other lack of coordination: Secondary | ICD-10-CM | POA: Diagnosis not present

## 2015-05-12 DIAGNOSIS — M13841 Other specified arthritis, right hand: Secondary | ICD-10-CM | POA: Diagnosis not present

## 2015-05-12 DIAGNOSIS — M13842 Other specified arthritis, left hand: Secondary | ICD-10-CM | POA: Diagnosis not present

## 2015-05-13 DIAGNOSIS — F331 Major depressive disorder, recurrent, moderate: Secondary | ICD-10-CM | POA: Diagnosis not present

## 2015-05-13 DIAGNOSIS — F411 Generalized anxiety disorder: Secondary | ICD-10-CM | POA: Diagnosis not present

## 2015-05-14 DIAGNOSIS — M13842 Other specified arthritis, left hand: Secondary | ICD-10-CM | POA: Diagnosis not present

## 2015-05-14 DIAGNOSIS — R278 Other lack of coordination: Secondary | ICD-10-CM | POA: Diagnosis not present

## 2015-05-14 DIAGNOSIS — M6281 Muscle weakness (generalized): Secondary | ICD-10-CM | POA: Diagnosis not present

## 2015-05-14 DIAGNOSIS — M13841 Other specified arthritis, right hand: Secondary | ICD-10-CM | POA: Diagnosis not present

## 2015-05-19 DIAGNOSIS — R278 Other lack of coordination: Secondary | ICD-10-CM | POA: Diagnosis not present

## 2015-05-19 DIAGNOSIS — M13842 Other specified arthritis, left hand: Secondary | ICD-10-CM | POA: Diagnosis not present

## 2015-05-19 DIAGNOSIS — M6281 Muscle weakness (generalized): Secondary | ICD-10-CM | POA: Diagnosis not present

## 2015-05-19 DIAGNOSIS — M13841 Other specified arthritis, right hand: Secondary | ICD-10-CM | POA: Diagnosis not present

## 2015-05-20 DIAGNOSIS — F411 Generalized anxiety disorder: Secondary | ICD-10-CM | POA: Diagnosis not present

## 2015-05-20 DIAGNOSIS — F331 Major depressive disorder, recurrent, moderate: Secondary | ICD-10-CM | POA: Diagnosis not present

## 2015-05-23 DIAGNOSIS — M6281 Muscle weakness (generalized): Secondary | ICD-10-CM | POA: Diagnosis not present

## 2015-05-23 DIAGNOSIS — M13842 Other specified arthritis, left hand: Secondary | ICD-10-CM | POA: Diagnosis not present

## 2015-05-23 DIAGNOSIS — M13841 Other specified arthritis, right hand: Secondary | ICD-10-CM | POA: Diagnosis not present

## 2015-05-23 DIAGNOSIS — R278 Other lack of coordination: Secondary | ICD-10-CM | POA: Diagnosis not present

## 2015-05-26 DIAGNOSIS — M13841 Other specified arthritis, right hand: Secondary | ICD-10-CM | POA: Diagnosis not present

## 2015-05-26 DIAGNOSIS — M13842 Other specified arthritis, left hand: Secondary | ICD-10-CM | POA: Diagnosis not present

## 2015-05-26 DIAGNOSIS — M6281 Muscle weakness (generalized): Secondary | ICD-10-CM | POA: Diagnosis not present

## 2015-05-26 DIAGNOSIS — R278 Other lack of coordination: Secondary | ICD-10-CM | POA: Diagnosis not present

## 2015-05-27 DIAGNOSIS — F411 Generalized anxiety disorder: Secondary | ICD-10-CM | POA: Diagnosis not present

## 2015-05-27 DIAGNOSIS — F331 Major depressive disorder, recurrent, moderate: Secondary | ICD-10-CM | POA: Diagnosis not present

## 2015-05-28 DIAGNOSIS — R278 Other lack of coordination: Secondary | ICD-10-CM | POA: Diagnosis not present

## 2015-05-28 DIAGNOSIS — M13842 Other specified arthritis, left hand: Secondary | ICD-10-CM | POA: Diagnosis not present

## 2015-05-28 DIAGNOSIS — M6281 Muscle weakness (generalized): Secondary | ICD-10-CM | POA: Diagnosis not present

## 2015-05-28 DIAGNOSIS — M13841 Other specified arthritis, right hand: Secondary | ICD-10-CM | POA: Diagnosis not present

## 2015-06-02 DIAGNOSIS — M13841 Other specified arthritis, right hand: Secondary | ICD-10-CM | POA: Diagnosis not present

## 2015-06-02 DIAGNOSIS — M6281 Muscle weakness (generalized): Secondary | ICD-10-CM | POA: Diagnosis not present

## 2015-06-02 DIAGNOSIS — M13842 Other specified arthritis, left hand: Secondary | ICD-10-CM | POA: Diagnosis not present

## 2015-06-02 DIAGNOSIS — R278 Other lack of coordination: Secondary | ICD-10-CM | POA: Diagnosis not present

## 2015-06-03 DIAGNOSIS — F331 Major depressive disorder, recurrent, moderate: Secondary | ICD-10-CM | POA: Diagnosis not present

## 2015-06-03 DIAGNOSIS — F411 Generalized anxiety disorder: Secondary | ICD-10-CM | POA: Diagnosis not present

## 2015-06-04 DIAGNOSIS — R278 Other lack of coordination: Secondary | ICD-10-CM | POA: Diagnosis not present

## 2015-06-04 DIAGNOSIS — M6281 Muscle weakness (generalized): Secondary | ICD-10-CM | POA: Diagnosis not present

## 2015-06-04 DIAGNOSIS — M13841 Other specified arthritis, right hand: Secondary | ICD-10-CM | POA: Diagnosis not present

## 2015-06-04 DIAGNOSIS — M13842 Other specified arthritis, left hand: Secondary | ICD-10-CM | POA: Diagnosis not present

## 2015-06-09 DIAGNOSIS — M6281 Muscle weakness (generalized): Secondary | ICD-10-CM | POA: Diagnosis not present

## 2015-06-09 DIAGNOSIS — M13841 Other specified arthritis, right hand: Secondary | ICD-10-CM | POA: Diagnosis not present

## 2015-06-09 DIAGNOSIS — R278 Other lack of coordination: Secondary | ICD-10-CM | POA: Diagnosis not present

## 2015-06-09 DIAGNOSIS — M13842 Other specified arthritis, left hand: Secondary | ICD-10-CM | POA: Diagnosis not present

## 2015-06-10 DIAGNOSIS — F411 Generalized anxiety disorder: Secondary | ICD-10-CM | POA: Diagnosis not present

## 2015-06-10 DIAGNOSIS — F331 Major depressive disorder, recurrent, moderate: Secondary | ICD-10-CM | POA: Diagnosis not present

## 2015-06-11 DIAGNOSIS — M13841 Other specified arthritis, right hand: Secondary | ICD-10-CM | POA: Diagnosis not present

## 2015-06-11 DIAGNOSIS — M6281 Muscle weakness (generalized): Secondary | ICD-10-CM | POA: Diagnosis not present

## 2015-06-11 DIAGNOSIS — R278 Other lack of coordination: Secondary | ICD-10-CM | POA: Diagnosis not present

## 2015-06-11 DIAGNOSIS — M13842 Other specified arthritis, left hand: Secondary | ICD-10-CM | POA: Diagnosis not present

## 2015-06-17 DIAGNOSIS — F331 Major depressive disorder, recurrent, moderate: Secondary | ICD-10-CM | POA: Diagnosis not present

## 2015-06-17 DIAGNOSIS — F411 Generalized anxiety disorder: Secondary | ICD-10-CM | POA: Diagnosis not present

## 2015-06-18 DIAGNOSIS — M6281 Muscle weakness (generalized): Secondary | ICD-10-CM | POA: Diagnosis not present

## 2015-06-18 DIAGNOSIS — R278 Other lack of coordination: Secondary | ICD-10-CM | POA: Diagnosis not present

## 2015-06-18 DIAGNOSIS — M13841 Other specified arthritis, right hand: Secondary | ICD-10-CM | POA: Diagnosis not present

## 2015-06-18 DIAGNOSIS — M13842 Other specified arthritis, left hand: Secondary | ICD-10-CM | POA: Diagnosis not present

## 2015-06-19 DIAGNOSIS — M13841 Other specified arthritis, right hand: Secondary | ICD-10-CM | POA: Diagnosis not present

## 2015-06-19 DIAGNOSIS — R278 Other lack of coordination: Secondary | ICD-10-CM | POA: Diagnosis not present

## 2015-06-19 DIAGNOSIS — M6281 Muscle weakness (generalized): Secondary | ICD-10-CM | POA: Diagnosis not present

## 2015-06-19 DIAGNOSIS — M13842 Other specified arthritis, left hand: Secondary | ICD-10-CM | POA: Diagnosis not present

## 2015-06-23 DIAGNOSIS — R278 Other lack of coordination: Secondary | ICD-10-CM | POA: Diagnosis not present

## 2015-06-23 DIAGNOSIS — M13842 Other specified arthritis, left hand: Secondary | ICD-10-CM | POA: Diagnosis not present

## 2015-06-23 DIAGNOSIS — M6281 Muscle weakness (generalized): Secondary | ICD-10-CM | POA: Diagnosis not present

## 2015-06-23 DIAGNOSIS — M13841 Other specified arthritis, right hand: Secondary | ICD-10-CM | POA: Diagnosis not present

## 2015-06-24 DIAGNOSIS — F331 Major depressive disorder, recurrent, moderate: Secondary | ICD-10-CM | POA: Diagnosis not present

## 2015-06-24 DIAGNOSIS — F411 Generalized anxiety disorder: Secondary | ICD-10-CM | POA: Diagnosis not present

## 2015-06-25 DIAGNOSIS — M13842 Other specified arthritis, left hand: Secondary | ICD-10-CM | POA: Diagnosis not present

## 2015-06-25 DIAGNOSIS — M6281 Muscle weakness (generalized): Secondary | ICD-10-CM | POA: Diagnosis not present

## 2015-06-25 DIAGNOSIS — R278 Other lack of coordination: Secondary | ICD-10-CM | POA: Diagnosis not present

## 2015-06-25 DIAGNOSIS — M13841 Other specified arthritis, right hand: Secondary | ICD-10-CM | POA: Diagnosis not present

## 2015-07-01 DIAGNOSIS — R278 Other lack of coordination: Secondary | ICD-10-CM | POA: Diagnosis not present

## 2015-07-01 DIAGNOSIS — F331 Major depressive disorder, recurrent, moderate: Secondary | ICD-10-CM | POA: Diagnosis not present

## 2015-07-01 DIAGNOSIS — M13841 Other specified arthritis, right hand: Secondary | ICD-10-CM | POA: Diagnosis not present

## 2015-07-01 DIAGNOSIS — M13842 Other specified arthritis, left hand: Secondary | ICD-10-CM | POA: Diagnosis not present

## 2015-07-01 DIAGNOSIS — F411 Generalized anxiety disorder: Secondary | ICD-10-CM | POA: Diagnosis not present

## 2015-07-01 DIAGNOSIS — M6281 Muscle weakness (generalized): Secondary | ICD-10-CM | POA: Diagnosis not present

## 2015-07-08 DIAGNOSIS — F331 Major depressive disorder, recurrent, moderate: Secondary | ICD-10-CM | POA: Diagnosis not present

## 2015-07-08 DIAGNOSIS — L89892 Pressure ulcer of other site, stage 2: Secondary | ICD-10-CM | POA: Diagnosis not present

## 2015-07-08 DIAGNOSIS — B351 Tinea unguium: Secondary | ICD-10-CM | POA: Diagnosis not present

## 2015-07-08 DIAGNOSIS — F411 Generalized anxiety disorder: Secondary | ICD-10-CM | POA: Diagnosis not present

## 2015-07-09 DIAGNOSIS — R278 Other lack of coordination: Secondary | ICD-10-CM | POA: Diagnosis not present

## 2015-07-09 DIAGNOSIS — M13842 Other specified arthritis, left hand: Secondary | ICD-10-CM | POA: Diagnosis not present

## 2015-07-09 DIAGNOSIS — M6281 Muscle weakness (generalized): Secondary | ICD-10-CM | POA: Diagnosis not present

## 2015-07-09 DIAGNOSIS — M13841 Other specified arthritis, right hand: Secondary | ICD-10-CM | POA: Diagnosis not present

## 2015-07-10 ENCOUNTER — Other Ambulatory Visit: Payer: Self-pay | Admitting: Internal Medicine

## 2015-07-15 DIAGNOSIS — F331 Major depressive disorder, recurrent, moderate: Secondary | ICD-10-CM | POA: Diagnosis not present

## 2015-07-15 DIAGNOSIS — F411 Generalized anxiety disorder: Secondary | ICD-10-CM | POA: Diagnosis not present

## 2015-07-16 DIAGNOSIS — M6281 Muscle weakness (generalized): Secondary | ICD-10-CM | POA: Diagnosis not present

## 2015-07-16 DIAGNOSIS — M13842 Other specified arthritis, left hand: Secondary | ICD-10-CM | POA: Diagnosis not present

## 2015-07-16 DIAGNOSIS — M13841 Other specified arthritis, right hand: Secondary | ICD-10-CM | POA: Diagnosis not present

## 2015-07-16 DIAGNOSIS — R278 Other lack of coordination: Secondary | ICD-10-CM | POA: Diagnosis not present

## 2015-07-17 DIAGNOSIS — E039 Hypothyroidism, unspecified: Secondary | ICD-10-CM | POA: Diagnosis not present

## 2015-07-17 DIAGNOSIS — E871 Hypo-osmolality and hyponatremia: Secondary | ICD-10-CM | POA: Diagnosis not present

## 2015-07-17 DIAGNOSIS — I10 Essential (primary) hypertension: Secondary | ICD-10-CM | POA: Diagnosis not present

## 2015-07-17 LAB — BASIC METABOLIC PANEL
BUN: 11 mg/dL (ref 4–21)
Creatinine: 0.9 mg/dL (ref 0.5–1.1)
Glucose: 89 mg/dL
Potassium: 4.2 mmol/L (ref 3.4–5.3)
Sodium: 134 mmol/L — AB (ref 137–147)

## 2015-07-17 LAB — HEPATIC FUNCTION PANEL
ALT: 8 U/L (ref 7–35)
AST: 13 U/L (ref 13–35)
Alkaline Phosphatase: 51 U/L (ref 25–125)

## 2015-07-17 LAB — CBC AND DIFFERENTIAL
HCT: 37 % (ref 36–46)
Hemoglobin: 11.5 g/dL — AB (ref 12.0–16.0)
Platelets: 191 10*3/uL (ref 150–399)
WBC: 7 10^3/mL

## 2015-07-17 LAB — TSH: TSH: 0.77 u[IU]/mL (ref 0.41–5.90)

## 2015-07-22 DIAGNOSIS — F411 Generalized anxiety disorder: Secondary | ICD-10-CM | POA: Diagnosis not present

## 2015-07-22 DIAGNOSIS — F331 Major depressive disorder, recurrent, moderate: Secondary | ICD-10-CM | POA: Diagnosis not present

## 2015-07-23 ENCOUNTER — Encounter: Payer: Self-pay | Admitting: Internal Medicine

## 2015-07-23 ENCOUNTER — Non-Acute Institutional Stay: Payer: Medicare Other | Admitting: Internal Medicine

## 2015-07-23 VITALS — BP 122/68 | HR 64 | Temp 98.8°F | Ht <= 58 in | Wt 145.0 lb

## 2015-07-23 DIAGNOSIS — E039 Hypothyroidism, unspecified: Secondary | ICD-10-CM

## 2015-07-23 DIAGNOSIS — I10 Essential (primary) hypertension: Secondary | ICD-10-CM | POA: Diagnosis not present

## 2015-07-23 DIAGNOSIS — N3941 Urge incontinence: Secondary | ICD-10-CM

## 2015-07-23 DIAGNOSIS — M2042 Other hammer toe(s) (acquired), left foot: Secondary | ICD-10-CM

## 2015-07-23 DIAGNOSIS — K5901 Slow transit constipation: Secondary | ICD-10-CM

## 2015-07-23 DIAGNOSIS — G894 Chronic pain syndrome: Secondary | ICD-10-CM

## 2015-07-23 NOTE — Patient Instructions (Signed)
I recommend you go to [employer] to see if they can develop an orthotic for you to help prevent sores from forming on your 2nd hammertoe on the left and also to prevent abrasions of your 5th toe with narrow shoes.  For now, continue with your cut-out shoe and rubber cushioned toe separator.

## 2015-07-23 NOTE — Progress Notes (Signed)
Location:  [facility] of Service:  Clinic (12)  Provider: Erasmo Vertz L. Mariea Clonts, D.O., C.M.D.  Code Status: DNR Goals of Care:  Advanced Directives 07/23/2015  Does patient have an advance directive? Yes  Type of Paramedic of [place];Out of facility DNR (pink MOST or yellow form)  Copy of advanced directive(s) in chart? Yes  Pre-existing out of facility DNR order (yellow form or pink MOST form) Yellow form placed in chart (order not valid for inpatient use)   Chief Complaint  Patient presents with  . Medical Management of Chronic Issues    4 mth follow-up    HPI: Patient is a 82 y.o. female seen today for medical management of chronic diseases.    Has a sore toe that has been going on for a few weeks now.  Had a callous on it.  Second toe on left foot.  Her daughter has cut a hole in the front of her left shoe for the hammertoe to fit in and not rub.  [employer] discussed.  Pain is well controlled.  Stable.  Back and hips.  Not bothered lately.  Depression:  Spirits are doing well.  Says she's on medication.  Blood pressure is good.  At goal.  Not dizzy or lightheaded.    Urge incontinence:  Unchanged.  Still gets up 2-3 times per night.   Does avoid fluids soon before bed.  Constipation:  Doing well with miralax.    Past Medical History  Diagnosis Date  . Spinal stenosis, unspecified region other than cervical   . Pain in joint, shoulder region   . Unspecified essential hypertension   . Other and unspecified hyperlipidemia   . Acute upper respiratory infections of unspecified site 04/28/2012  . Edema 02/09/2012  . Other specified erythematous condition(695.89) 01/24/2012  . Lumbago 12/27/2011  . Rash and other nonspecific skin eruption 12/27/2011  . Unspecified hypothyroidism 11/10/2011  . Anxiety state, unspecified 11/10/2011  . Depressive disorder, not elsewhere classified 11/10/2011  . Reflux esophagitis 11/10/2011  .  Unspecified constipation 11/10/2011  . Osteoarthrosis, unspecified whether generalized or localized, unspecified site 11/10/2011  . Backache, unspecified 11/10/2011  . Unspecified urinary incontinence 11/10/2011  . Memory loss 12/18/2010  . Unspecified hypothyroidism   . Abnormality of gait 11/15/2012    Past Surgical History  Procedure Laterality Date  . Back surgery      x3  . Rotator cuff repair Right   . Total abdominal hysterectomy w/ bilateral salpingoophorectomy  1972  . Cataract extraction w/ intraocular lens  implant, bilateral  2009-2010    Allergies  Allergen Reactions  . Cozaar [Losartan Potassium] Swelling  . Doxycycline   . Lamictal [Lamotrigine]   . Nsaids   . Neurontin [Gabapentin]       Medication List       This list is accurate as of: 07/23/15  2:06 PM.  Always use your most recent med list.               ALPRAZolam 0.25 MG tablet  Commonly known as:  XANAX  Take 0.25 mg by mouth. Take one at bedtime     ARIPiprazole 2 MG tablet  Commonly known as:  ABILIFY  Take 2 mg by mouth daily. Take 1 tablet at bedtime for depression     busPIRone 15 MG tablet  Commonly known as:  BUSPAR  Take one tablet three times  daily     CALCIUM 600+D3 600-400 MG-UNIT tablet  Generic drug:  Calcium Carbonate-Vitamin D  Take 1 tablet by mouth daily. Take one tablet  daily     CARRAVITE PO  Take by mouth. Take one tablet daily     cetaphil lotion  Apply 1 application topically. Apply twice a day as needed for dry, scaly skin     FLUORIDEX DAILY DEFENSE 1.1 % Gel dental gel  Generic drug:  sodium fluoride  Place 1 application onto teeth at bedtime.     hydrocortisone 1 % lotion  Apply 1 application topically. To face at bedtime     levothyroxine 75 MCG tablet  Commonly known as:  SYNTHROID  Take 1/2 tablet in morning 30 minutes before breakfast for thyroid     Melatonin 3 MG Caps  Take by mouth. Take one capsule daily     methadone 5 MG tablet  Commonly  known as:  DOLOPHINE  Take 7.5 mg by mouth every evening. Take 5 mg twice daily     metoprolol succinate 25 MG 24 hr tablet  Commonly known as:  TOPROL-XL  Take one tablet by mouth once daily for blood pressure     omeprazole 20 MG capsule  Commonly known as:  PRILOSEC  TAKE ONE CAPSULE TWICE A DAY TO REDUCE STOMACH ACID     polyethylene glycol packet  Commonly known as:  MIRALAX / GLYCOLAX  Take 0.5 packets by mouth daily as needed for mild constipation.     sertraline 50 MG tablet  Commonly known as:  ZOLOFT  TAKE ONE TABLET EACH DAY FOR DEPRESSION        Review of Systems:  Review of Systems  Constitutional: Negative for fever, chills and malaise/fatigue.  HENT: Positive for hearing loss. Negative for congestion.   Eyes: Negative for blurred vision.       Glasses  Respiratory: Negative for shortness of breath.   Cardiovascular: Negative for chest pain and leg swelling.  Gastrointestinal: Positive for constipation. Negative for abdominal pain, diarrhea, blood in stool and melena.       Bowels moving with miralax  Genitourinary: Positive for urgency and frequency. Negative for dysuria and hematuria.  Musculoskeletal: Positive for back pain and joint pain. Negative for falls.       Pain controlled  Skin: Negative for rash.  Neurological: Negative for dizziness, loss of consciousness and weakness.  Psychiatric/Behavioral: Negative for depression and memory loss. The patient is not nervous/anxious and does not have insomnia.     Health Maintenance  Topic Date Due  . TETANUS/TDAP  05/30/1948  . DEXA SCAN  05/31/1994  . INFLUENZA VACCINE  10/14/2015  . ZOSTAVAX  Addressed  . PNA vac Low Risk Adult  Completed    Physical Exam: Filed Vitals:   07/23/15 1350  BP: 122/68  Pulse: 64  Temp: 98.8 F (37.1 C)  TempSrc: Oral  Height: 4\' 10"  (1.473 m)  Weight: 145 lb (65.772 kg)  SpO2: 95%   Body mass index is 30.31 kg/(m^2). Physical Exam  Constitutional: She is  oriented to person, place, and time. She appears well-developed and well-nourished. No distress.  Cardiovascular: Normal rate, regular rhythm, normal heart sounds and intact distal pulses.   Pulmonary/Chest: Effort normal and breath sounds normal. No respiratory distress.  Abdominal: Soft. Bowel sounds are normal. She exhibits no distension and no mass. There is no tenderness.  Musculoskeletal: Normal range of motion.  Walks with stooped posture with rollator walker  Neurological: She is alert and oriented to person, place, and time.  Skin: Skin is warm and dry. There is pallor.  Psychiatric: She has a normal mood and affect.    Labs reviewed: Basic Metabolic Panel:  Recent Labs  11/14/14 12/26/14  NA  --  135*  K  --  4.4  BUN  --  15  CREATININE  --  1.0  TSH 0.22* 0.40*   Liver Function Tests:  Recent Labs  12/26/14  AST 17  ALT 11  ALKPHOS 58   No results for input(s): LIPASE, AMYLASE in the last 8760 hours. No results for input(s): AMMONIA in the last 8760 hours. CBC:  Recent Labs  12/26/14  WBC 7.2  HGB 11.7*  HCT 35*  PLT 199   Lipid Panel:  Recent Labs  12/26/14  CHOL 227*  HDL 24*  LDLCALC 164  TRIG 181*   Lab Results  Component Value Date   HGBA1C 6.2 12/26/2014    Assessment/Plan 1. Hammertoe, left -second toe -has a sore on the interphalangeal joint of this toe due to pressure from her shoes--her daughter has "done surgery" on the shoe to open the area where it rubs which is effective for prevention further injury -also using a band around the toe with benefit -also would advise a wider shoe as her 5th toe is getting pressure on it -given the address and number for [employer] to see about getting the ideal shoe made to accommodate her wider foot and hammertoe  2. Slow transit constipation -bowels are moving with use of miralax  3. Urge urinary incontinence -persists with 2-3x at night which she is coping with -most meds not safe and not  interested in myrbetriq  4. Chronic pain syndrome -well controlled at this time with her methadone 5mg  twice a day with 7.5mg  in the evening  5. Benign essential HTN -bp at goal with current meds w/o dizziness/orthostasis so continue same regimen and monitor  6. Hypothyroidism, unspecified hypothyroidism type -TSH just done wnl so cont same synthroid and recheck in 6-12 mos unless clinically changes  Labs/tests ordered:  [employer] recommendation Next appt:  4 mos for med mgt  Racquel Arkin L. Charlei Ramsaran, D.O. [facility] [address], [place] [address] Cell Phone (Mon-Fri 8am-5pm):  [phone] On Call:  [phone] & follow prompts after 5pm & weekends Office Phone:  [phone] Office Fax:  [phone]

## 2015-07-28 ENCOUNTER — Other Ambulatory Visit: Payer: Self-pay | Admitting: *Deleted

## 2015-07-28 MED ORDER — ALPRAZOLAM 0.25 MG PO TABS: ORAL_TABLET | ORAL | Status: DC

## 2015-07-28 NOTE — Telephone Encounter (Signed)
Kelly Irwin 

## 2015-07-29 DIAGNOSIS — F411 Generalized anxiety disorder: Secondary | ICD-10-CM | POA: Diagnosis not present

## 2015-07-29 DIAGNOSIS — F331 Major depressive disorder, recurrent, moderate: Secondary | ICD-10-CM | POA: Diagnosis not present

## 2015-07-30 DIAGNOSIS — M6281 Muscle weakness (generalized): Secondary | ICD-10-CM | POA: Diagnosis not present

## 2015-07-30 DIAGNOSIS — R278 Other lack of coordination: Secondary | ICD-10-CM | POA: Diagnosis not present

## 2015-07-30 DIAGNOSIS — M13842 Other specified arthritis, left hand: Secondary | ICD-10-CM | POA: Diagnosis not present

## 2015-07-30 DIAGNOSIS — M13841 Other specified arthritis, right hand: Secondary | ICD-10-CM | POA: Diagnosis not present

## 2015-08-01 DIAGNOSIS — R278 Other lack of coordination: Secondary | ICD-10-CM | POA: Diagnosis not present

## 2015-08-01 DIAGNOSIS — M13841 Other specified arthritis, right hand: Secondary | ICD-10-CM | POA: Diagnosis not present

## 2015-08-01 DIAGNOSIS — M6281 Muscle weakness (generalized): Secondary | ICD-10-CM | POA: Diagnosis not present

## 2015-08-01 DIAGNOSIS — M13842 Other specified arthritis, left hand: Secondary | ICD-10-CM | POA: Diagnosis not present

## 2015-08-04 ENCOUNTER — Other Ambulatory Visit: Payer: Self-pay | Admitting: Internal Medicine

## 2015-08-04 DIAGNOSIS — R278 Other lack of coordination: Secondary | ICD-10-CM | POA: Diagnosis not present

## 2015-08-04 DIAGNOSIS — M6281 Muscle weakness (generalized): Secondary | ICD-10-CM | POA: Diagnosis not present

## 2015-08-04 DIAGNOSIS — M13841 Other specified arthritis, right hand: Secondary | ICD-10-CM | POA: Diagnosis not present

## 2015-08-04 DIAGNOSIS — M13842 Other specified arthritis, left hand: Secondary | ICD-10-CM | POA: Diagnosis not present

## 2015-08-05 DIAGNOSIS — M6281 Muscle weakness (generalized): Secondary | ICD-10-CM | POA: Diagnosis not present

## 2015-08-05 DIAGNOSIS — M13841 Other specified arthritis, right hand: Secondary | ICD-10-CM | POA: Diagnosis not present

## 2015-08-05 DIAGNOSIS — R278 Other lack of coordination: Secondary | ICD-10-CM | POA: Diagnosis not present

## 2015-08-05 DIAGNOSIS — M13842 Other specified arthritis, left hand: Secondary | ICD-10-CM | POA: Diagnosis not present

## 2015-08-12 DIAGNOSIS — R278 Other lack of coordination: Secondary | ICD-10-CM | POA: Diagnosis not present

## 2015-08-12 DIAGNOSIS — M6281 Muscle weakness (generalized): Secondary | ICD-10-CM | POA: Diagnosis not present

## 2015-08-12 DIAGNOSIS — M13841 Other specified arthritis, right hand: Secondary | ICD-10-CM | POA: Diagnosis not present

## 2015-08-12 DIAGNOSIS — F411 Generalized anxiety disorder: Secondary | ICD-10-CM | POA: Diagnosis not present

## 2015-08-12 DIAGNOSIS — F331 Major depressive disorder, recurrent, moderate: Secondary | ICD-10-CM | POA: Diagnosis not present

## 2015-08-12 DIAGNOSIS — M13842 Other specified arthritis, left hand: Secondary | ICD-10-CM | POA: Diagnosis not present

## 2015-08-13 ENCOUNTER — Other Ambulatory Visit: Payer: Self-pay | Admitting: Internal Medicine

## 2015-08-14 DIAGNOSIS — M13842 Other specified arthritis, left hand: Secondary | ICD-10-CM | POA: Diagnosis not present

## 2015-08-14 DIAGNOSIS — M13841 Other specified arthritis, right hand: Secondary | ICD-10-CM | POA: Diagnosis not present

## 2015-08-14 DIAGNOSIS — R278 Other lack of coordination: Secondary | ICD-10-CM | POA: Diagnosis not present

## 2015-08-14 DIAGNOSIS — M6281 Muscle weakness (generalized): Secondary | ICD-10-CM | POA: Diagnosis not present

## 2015-08-19 DIAGNOSIS — M13842 Other specified arthritis, left hand: Secondary | ICD-10-CM | POA: Diagnosis not present

## 2015-08-19 DIAGNOSIS — M6281 Muscle weakness (generalized): Secondary | ICD-10-CM | POA: Diagnosis not present

## 2015-08-19 DIAGNOSIS — F331 Major depressive disorder, recurrent, moderate: Secondary | ICD-10-CM | POA: Diagnosis not present

## 2015-08-19 DIAGNOSIS — F411 Generalized anxiety disorder: Secondary | ICD-10-CM | POA: Diagnosis not present

## 2015-08-19 DIAGNOSIS — R278 Other lack of coordination: Secondary | ICD-10-CM | POA: Diagnosis not present

## 2015-08-19 DIAGNOSIS — M13841 Other specified arthritis, right hand: Secondary | ICD-10-CM | POA: Diagnosis not present

## 2015-08-21 DIAGNOSIS — M13841 Other specified arthritis, right hand: Secondary | ICD-10-CM | POA: Diagnosis not present

## 2015-08-21 DIAGNOSIS — M13842 Other specified arthritis, left hand: Secondary | ICD-10-CM | POA: Diagnosis not present

## 2015-08-21 DIAGNOSIS — R278 Other lack of coordination: Secondary | ICD-10-CM | POA: Diagnosis not present

## 2015-08-21 DIAGNOSIS — M6281 Muscle weakness (generalized): Secondary | ICD-10-CM | POA: Diagnosis not present

## 2015-08-26 DIAGNOSIS — F331 Major depressive disorder, recurrent, moderate: Secondary | ICD-10-CM | POA: Diagnosis not present

## 2015-08-26 DIAGNOSIS — F411 Generalized anxiety disorder: Secondary | ICD-10-CM | POA: Diagnosis not present

## 2015-09-02 DIAGNOSIS — F411 Generalized anxiety disorder: Secondary | ICD-10-CM | POA: Diagnosis not present

## 2015-09-02 DIAGNOSIS — F331 Major depressive disorder, recurrent, moderate: Secondary | ICD-10-CM | POA: Diagnosis not present

## 2015-09-04 DIAGNOSIS — L84 Corns and callosities: Secondary | ICD-10-CM | POA: Diagnosis not present

## 2015-09-04 DIAGNOSIS — M205X9 Other deformities of toe(s) (acquired), unspecified foot: Secondary | ICD-10-CM | POA: Diagnosis not present

## 2015-09-04 DIAGNOSIS — L89899 Pressure ulcer of other site, unspecified stage: Secondary | ICD-10-CM | POA: Diagnosis not present

## 2015-09-04 DIAGNOSIS — B351 Tinea unguium: Secondary | ICD-10-CM | POA: Diagnosis not present

## 2015-09-09 DIAGNOSIS — F331 Major depressive disorder, recurrent, moderate: Secondary | ICD-10-CM | POA: Diagnosis not present

## 2015-09-09 DIAGNOSIS — F411 Generalized anxiety disorder: Secondary | ICD-10-CM | POA: Diagnosis not present

## 2015-09-17 DIAGNOSIS — L89899 Pressure ulcer of other site, unspecified stage: Secondary | ICD-10-CM | POA: Diagnosis not present

## 2015-09-29 ENCOUNTER — Other Ambulatory Visit: Payer: Self-pay | Admitting: Internal Medicine

## 2015-10-07 DIAGNOSIS — F411 Generalized anxiety disorder: Secondary | ICD-10-CM | POA: Diagnosis not present

## 2015-10-07 DIAGNOSIS — F331 Major depressive disorder, recurrent, moderate: Secondary | ICD-10-CM | POA: Diagnosis not present

## 2015-10-09 ENCOUNTER — Other Ambulatory Visit: Payer: Self-pay | Admitting: Internal Medicine

## 2015-10-14 DIAGNOSIS — F331 Major depressive disorder, recurrent, moderate: Secondary | ICD-10-CM | POA: Diagnosis not present

## 2015-10-14 DIAGNOSIS — F411 Generalized anxiety disorder: Secondary | ICD-10-CM | POA: Diagnosis not present

## 2015-10-21 DIAGNOSIS — F411 Generalized anxiety disorder: Secondary | ICD-10-CM | POA: Diagnosis not present

## 2015-10-21 DIAGNOSIS — F331 Major depressive disorder, recurrent, moderate: Secondary | ICD-10-CM | POA: Diagnosis not present

## 2015-10-29 ENCOUNTER — Encounter: Payer: Self-pay | Admitting: Internal Medicine

## 2015-10-29 ENCOUNTER — Non-Acute Institutional Stay: Payer: Medicare Other | Admitting: Internal Medicine

## 2015-10-29 VITALS — BP 112/68 | HR 55 | Temp 98.5°F | Wt 144.0 lb

## 2015-10-29 DIAGNOSIS — T148XXA Other injury of unspecified body region, initial encounter: Secondary | ICD-10-CM

## 2015-10-29 DIAGNOSIS — T148 Other injury of unspecified body region: Secondary | ICD-10-CM | POA: Diagnosis not present

## 2015-10-29 NOTE — Progress Notes (Signed)
Location:  [place] of Service:  Clinic (12)  Provider: Camdan Burdi L. Mariea Clonts, D.O., C.M.D.  Code Status: DNR Goals of Care:  Advanced Directives 10/29/2015  Does patient have an advance directive? Yes  Type of Advance Directive Out of facility DNR (pink MOST or yellow form);Healthcare Power of Attorney  Does patient want to make changes to advanced directive? -  Copy of advanced directive(s) in chart? Yes  Pre-existing out of facility DNR order (yellow form or pink MOST form) -     Chief Complaint  Patient presents with  . Acute Visit    cellulitis    HPI: Patient is a 82 y.o. female seen today for an acute visit for possible cellulitis of the legs after bumping them on her bed. She notes tenderness in the vicinity of the injuries.  There is no drainage.  There is very little erythema present.  No significant swelling.  She's been afebrile.  Past Medical History:  Diagnosis Date  . Abnormality of gait 11/15/2012  . Acute upper respiratory infections of unspecified site 04/28/2012  . Anxiety state, unspecified 11/10/2011  . Backache, unspecified 11/10/2011  . Depressive disorder, not elsewhere classified 11/10/2011  . Edema 02/09/2012  . Lumbago 12/27/2011  . Memory loss 12/18/2010  . Osteoarthrosis, unspecified whether generalized or localized, unspecified site 11/10/2011  . Other and unspecified hyperlipidemia   . Other specified erythematous condition(695.89) 01/24/2012  . Pain in joint, shoulder region   . Rash and other nonspecific skin eruption 12/27/2011  . Reflux esophagitis 11/10/2011  . Spinal stenosis, unspecified region other than cervical   . Unspecified constipation 11/10/2011  . Unspecified essential hypertension   . Unspecified hypothyroidism 11/10/2011  . Unspecified hypothyroidism   . Unspecified urinary incontinence 11/10/2011    Past Surgical History:  Procedure Laterality Date  . BACK SURGERY     x3  . CATARACT EXTRACTION W/ INTRAOCULAR LENS   IMPLANT, BILATERAL  2009-2010  . ROTATOR CUFF REPAIR Right   . TOTAL ABDOMINAL HYSTERECTOMY W/ BILATERAL SALPINGOOPHORECTOMY  1972    Allergies  Allergen Reactions  . Cozaar [Losartan Potassium] Swelling  . Doxycycline   . Lamictal [Lamotrigine]   . Nsaids   . Neurontin [Gabapentin]       Medication List       Accurate as of 10/29/15  9:36 AM. Always use your most recent med list.          ALPRAZolam 0.25 MG tablet Commonly known as:  XANAX Take one tablet by mouth at bedtime for anxiety   ARIPiprazole 2 MG tablet Commonly known as:  ABILIFY Take 2 mg by mouth daily.   busPIRone 30 MG tablet Commonly known as:  BUSPAR Take 30 mg by mouth 2 (two) times daily.   CALCIUM 600+D3 600-400 MG-UNIT tablet Generic drug:  Calcium Carbonate-Vitamin D Take 1 tablet by mouth daily. Take one tablet  daily   CARRAVITE PO Take by mouth. Take one tablet daily   cetaphil lotion Apply 1 application topically. Apply twice a day as needed for dry, scaly skin   FLUORIDEX DAILY DEFENSE 1.1 % Gel dental gel Generic drug:  sodium fluoride Place 1 application onto teeth at bedtime.   hydrocortisone 1 % lotion Apply 1 application topically. To face at bedtime   ketoconazole 2 % shampoo Commonly known as:  NIZORAL APPLY TO SCALP AND LET SIT FOR 5 MINUTESTHEN RINSE ONCE A DAY ON TUESDAY THURSDAY   levothyroxine 75 MCG tablet Commonly known as:  SYNTHROID,  LEVOTHROID TAKE 1/2 TABLET EVERY MORNING 30 MINUTESBEFORE BREAKFAST FOR THYROID   Melatonin 3 MG Caps Take by mouth. Take one capsule daily   methadone 5 MG tablet Commonly known as:  DOLOPHINE Take 7.5 mg by mouth every evening. Take 5 mg twice daily   metoprolol succinate 25 MG 24 hr tablet Commonly known as:  TOPROL-XL TAKE ONE TABLET EACH DAY FOR BLOOD PRESSURE   omeprazole 20 MG capsule Commonly known as:  PRILOSEC ONE CAPSULE TWICE A DAY TO REDUCE STPMACH ACID   sertraline 50 MG tablet Commonly known as:   ZOLOFT TAKE ONE TABLET EACH DAY FOR DEPRESSION   VOLTAREN 1 % Gel Generic drug:  diclofenac sodium Apply 2 g topically as needed.       Review of Systems:  Review of Systems  Constitutional: Negative for chills and fever.  Respiratory: Negative for shortness of breath.   Cardiovascular: Negative for chest pain, palpitations and leg swelling.  Musculoskeletal: Negative for falls.  Skin:       Excoriations of both anterior shins from underside of her bed    Health Maintenance  Topic Date Due  . TETANUS/TDAP  05/30/1948  . DEXA SCAN  05/31/1994  . INFLUENZA VACCINE  10/14/2015  . ZOSTAVAX  Addressed  . PNA vac Low Risk Adult  Completed    Physical Exam: Vitals:   10/29/15 0917  BP: 112/68  Pulse: (!) 55  Temp: 98.5 F (36.9 C)  TempSrc: Oral  SpO2: 95%  Weight: 144 lb (65.3 kg)   Body mass index is 30.1 kg/m. Physical Exam  Constitutional: She is oriented to person, place, and time. She appears well-developed and well-nourished. No distress.  Neurological: She is alert and oriented to person, place, and time.  Skin: Skin is warm and dry.     Right leg with moist eschar, left dry eschar; minimal lateral erythema and tiniest bit of warmth on right with more vertical touch of erythema on left; nontender    Labs reviewed: Basic Metabolic Panel:  Recent Labs  11/14/14 12/26/14 07/17/15 1251  NA  --  135* 134*  K  --  4.4 4.2  BUN  --  15 11  CREATININE  --  1.0 0.9  TSH 0.22* 0.40* 0.77   Liver Function Tests:  Recent Labs  12/26/14 07/17/15 1251  AST 17 13  ALT 11 8  ALKPHOS 58 51   No results for input(s): LIPASE, AMYLASE in the last 8760 hours. No results for input(s): AMMONIA in the last 8760 hours. CBC:  Recent Labs  12/26/14 07/17/15 1251  WBC 7.2 7.0  HGB 11.7* 11.5*  HCT 35* 37  PLT 199 191   Lipid Panel:  Recent Labs  12/26/14  CHOL 227*  HDL 24*  LDLCALC 164  TRIG 181*   Lab Results  Component Value Date   HGBA1C 6.2  12/26/2014    Assessment/Plan 1. Skin excoriation -cleanse bilateral LE wounds with wound cleanser, apply triple antibiotic ointment, cover with telfa and wrap with loose kerlix, she may shower, do daily and prn soiling until healed  Next appt:  11/26/2015  Takuya Lariccia L. Brandyn Thien, D.O. [facility] [address], [place] [address] Cell Phone (Mon-Fri 8am-5pm):  [phone] On Call:  [phone] & follow prompts after 5pm & weekends Office Phone:  [phone] Office Fax:  [phone]

## 2015-11-03 ENCOUNTER — Other Ambulatory Visit: Payer: Self-pay | Admitting: Internal Medicine

## 2015-11-03 ENCOUNTER — Other Ambulatory Visit: Payer: Self-pay | Admitting: *Deleted

## 2015-11-06 DIAGNOSIS — L89899 Pressure ulcer of other site, unspecified stage: Secondary | ICD-10-CM | POA: Diagnosis not present

## 2015-11-06 DIAGNOSIS — B351 Tinea unguium: Secondary | ICD-10-CM | POA: Diagnosis not present

## 2015-11-07 DIAGNOSIS — R2689 Other abnormalities of gait and mobility: Secondary | ICD-10-CM | POA: Diagnosis not present

## 2015-11-07 DIAGNOSIS — M79605 Pain in left leg: Secondary | ICD-10-CM | POA: Diagnosis not present

## 2015-11-07 DIAGNOSIS — M25562 Pain in left knee: Secondary | ICD-10-CM | POA: Diagnosis not present

## 2015-11-07 DIAGNOSIS — M4800 Spinal stenosis, site unspecified: Secondary | ICD-10-CM | POA: Diagnosis not present

## 2015-11-07 DIAGNOSIS — R278 Other lack of coordination: Secondary | ICD-10-CM | POA: Diagnosis not present

## 2015-11-07 DIAGNOSIS — R2681 Unsteadiness on feet: Secondary | ICD-10-CM | POA: Diagnosis not present

## 2015-11-07 DIAGNOSIS — Z9181 History of falling: Secondary | ICD-10-CM | POA: Diagnosis not present

## 2015-11-10 DIAGNOSIS — R2681 Unsteadiness on feet: Secondary | ICD-10-CM | POA: Diagnosis not present

## 2015-11-10 DIAGNOSIS — M25562 Pain in left knee: Secondary | ICD-10-CM | POA: Diagnosis not present

## 2015-11-10 DIAGNOSIS — R278 Other lack of coordination: Secondary | ICD-10-CM | POA: Diagnosis not present

## 2015-11-10 DIAGNOSIS — R2689 Other abnormalities of gait and mobility: Secondary | ICD-10-CM | POA: Diagnosis not present

## 2015-11-10 DIAGNOSIS — M4800 Spinal stenosis, site unspecified: Secondary | ICD-10-CM | POA: Diagnosis not present

## 2015-11-10 DIAGNOSIS — M79605 Pain in left leg: Secondary | ICD-10-CM | POA: Diagnosis not present

## 2015-11-12 ENCOUNTER — Other Ambulatory Visit: Payer: Self-pay | Admitting: Internal Medicine

## 2015-11-12 DIAGNOSIS — M4800 Spinal stenosis, site unspecified: Secondary | ICD-10-CM | POA: Diagnosis not present

## 2015-11-12 DIAGNOSIS — M79605 Pain in left leg: Secondary | ICD-10-CM | POA: Diagnosis not present

## 2015-11-12 DIAGNOSIS — R2681 Unsteadiness on feet: Secondary | ICD-10-CM | POA: Diagnosis not present

## 2015-11-12 DIAGNOSIS — M25562 Pain in left knee: Secondary | ICD-10-CM | POA: Diagnosis not present

## 2015-11-12 DIAGNOSIS — R2689 Other abnormalities of gait and mobility: Secondary | ICD-10-CM | POA: Diagnosis not present

## 2015-11-12 DIAGNOSIS — R278 Other lack of coordination: Secondary | ICD-10-CM | POA: Diagnosis not present

## 2015-11-14 DIAGNOSIS — R2689 Other abnormalities of gait and mobility: Secondary | ICD-10-CM | POA: Diagnosis not present

## 2015-11-14 DIAGNOSIS — M4800 Spinal stenosis, site unspecified: Secondary | ICD-10-CM | POA: Diagnosis not present

## 2015-11-14 DIAGNOSIS — M25562 Pain in left knee: Secondary | ICD-10-CM | POA: Diagnosis not present

## 2015-11-14 DIAGNOSIS — Z9181 History of falling: Secondary | ICD-10-CM | POA: Diagnosis not present

## 2015-11-14 DIAGNOSIS — M79605 Pain in left leg: Secondary | ICD-10-CM | POA: Diagnosis not present

## 2015-11-14 DIAGNOSIS — R278 Other lack of coordination: Secondary | ICD-10-CM | POA: Diagnosis not present

## 2015-11-14 DIAGNOSIS — R2681 Unsteadiness on feet: Secondary | ICD-10-CM | POA: Diagnosis not present

## 2015-11-18 DIAGNOSIS — M79605 Pain in left leg: Secondary | ICD-10-CM | POA: Diagnosis not present

## 2015-11-18 DIAGNOSIS — R2689 Other abnormalities of gait and mobility: Secondary | ICD-10-CM | POA: Diagnosis not present

## 2015-11-18 DIAGNOSIS — M4800 Spinal stenosis, site unspecified: Secondary | ICD-10-CM | POA: Diagnosis not present

## 2015-11-18 DIAGNOSIS — R278 Other lack of coordination: Secondary | ICD-10-CM | POA: Diagnosis not present

## 2015-11-18 DIAGNOSIS — R2681 Unsteadiness on feet: Secondary | ICD-10-CM | POA: Diagnosis not present

## 2015-11-18 DIAGNOSIS — M25562 Pain in left knee: Secondary | ICD-10-CM | POA: Diagnosis not present

## 2015-11-19 DIAGNOSIS — M79605 Pain in left leg: Secondary | ICD-10-CM | POA: Diagnosis not present

## 2015-11-19 DIAGNOSIS — M4800 Spinal stenosis, site unspecified: Secondary | ICD-10-CM | POA: Diagnosis not present

## 2015-11-19 DIAGNOSIS — R2681 Unsteadiness on feet: Secondary | ICD-10-CM | POA: Diagnosis not present

## 2015-11-19 DIAGNOSIS — M25562 Pain in left knee: Secondary | ICD-10-CM | POA: Diagnosis not present

## 2015-11-19 DIAGNOSIS — R278 Other lack of coordination: Secondary | ICD-10-CM | POA: Diagnosis not present

## 2015-11-19 DIAGNOSIS — R2689 Other abnormalities of gait and mobility: Secondary | ICD-10-CM | POA: Diagnosis not present

## 2015-11-20 DIAGNOSIS — R2681 Unsteadiness on feet: Secondary | ICD-10-CM | POA: Diagnosis not present

## 2015-11-20 DIAGNOSIS — M4800 Spinal stenosis, site unspecified: Secondary | ICD-10-CM | POA: Diagnosis not present

## 2015-11-20 DIAGNOSIS — M25562 Pain in left knee: Secondary | ICD-10-CM | POA: Diagnosis not present

## 2015-11-20 DIAGNOSIS — R2689 Other abnormalities of gait and mobility: Secondary | ICD-10-CM | POA: Diagnosis not present

## 2015-11-20 DIAGNOSIS — M79605 Pain in left leg: Secondary | ICD-10-CM | POA: Diagnosis not present

## 2015-11-20 DIAGNOSIS — R278 Other lack of coordination: Secondary | ICD-10-CM | POA: Diagnosis not present

## 2015-11-24 DIAGNOSIS — M79605 Pain in left leg: Secondary | ICD-10-CM | POA: Diagnosis not present

## 2015-11-24 DIAGNOSIS — M4800 Spinal stenosis, site unspecified: Secondary | ICD-10-CM | POA: Diagnosis not present

## 2015-11-24 DIAGNOSIS — R2681 Unsteadiness on feet: Secondary | ICD-10-CM | POA: Diagnosis not present

## 2015-11-24 DIAGNOSIS — R278 Other lack of coordination: Secondary | ICD-10-CM | POA: Diagnosis not present

## 2015-11-24 DIAGNOSIS — M25562 Pain in left knee: Secondary | ICD-10-CM | POA: Diagnosis not present

## 2015-11-24 DIAGNOSIS — R2689 Other abnormalities of gait and mobility: Secondary | ICD-10-CM | POA: Diagnosis not present

## 2015-11-25 DIAGNOSIS — M4800 Spinal stenosis, site unspecified: Secondary | ICD-10-CM | POA: Diagnosis not present

## 2015-11-25 DIAGNOSIS — R278 Other lack of coordination: Secondary | ICD-10-CM | POA: Diagnosis not present

## 2015-11-25 DIAGNOSIS — R2689 Other abnormalities of gait and mobility: Secondary | ICD-10-CM | POA: Diagnosis not present

## 2015-11-25 DIAGNOSIS — R2681 Unsteadiness on feet: Secondary | ICD-10-CM | POA: Diagnosis not present

## 2015-11-25 DIAGNOSIS — M79605 Pain in left leg: Secondary | ICD-10-CM | POA: Diagnosis not present

## 2015-11-25 DIAGNOSIS — M25562 Pain in left knee: Secondary | ICD-10-CM | POA: Diagnosis not present

## 2015-11-26 ENCOUNTER — Non-Acute Institutional Stay: Payer: Medicare Other | Admitting: Internal Medicine

## 2015-11-26 ENCOUNTER — Encounter: Payer: Self-pay | Admitting: Internal Medicine

## 2015-11-26 VITALS — BP 120/60 | HR 74 | Temp 99.1°F | Ht <= 58 in | Wt 144.0 lb

## 2015-11-26 DIAGNOSIS — T148XXA Other injury of unspecified body region, initial encounter: Secondary | ICD-10-CM

## 2015-11-26 DIAGNOSIS — F32A Depression, unspecified: Secondary | ICD-10-CM

## 2015-11-26 DIAGNOSIS — T148 Other injury of unspecified body region: Secondary | ICD-10-CM

## 2015-11-26 DIAGNOSIS — F329 Major depressive disorder, single episode, unspecified: Secondary | ICD-10-CM | POA: Diagnosis not present

## 2015-11-26 DIAGNOSIS — G894 Chronic pain syndrome: Secondary | ICD-10-CM

## 2015-11-26 DIAGNOSIS — G4700 Insomnia, unspecified: Secondary | ICD-10-CM

## 2015-11-26 DIAGNOSIS — K5901 Slow transit constipation: Secondary | ICD-10-CM | POA: Diagnosis not present

## 2015-11-26 NOTE — Progress Notes (Signed)
Location:   [place] of Service:  Clinic (12)  Provider: Cederic Mozley L. Mariea Clonts, D.O., C.M.D.  Code Status: DNR Goals of Care:  Advanced Directives 11/26/2015  Does patient have an advance directive? -  Type of Advance Directive Out of facility DNR (pink MOST or yellow form);Healthcare Power of Attorney  Does patient want to make changes to advanced directive? -  Copy of advanced directive(s) in chart? Yes  Pre-existing out of facility DNR order (yellow form or pink MOST form) Yellow form placed in chart (order not valid for inpatient use)   Chief Complaint  Patient presents with  . Medical Management of Chronic Issues    4 mth follow-up    HPI: Patient is a 82 y.o. female seen today for medical management of chronic diseases.    Has a bad case of constipation.  It's going on for a "couple of days--2-3"  Not having pain.  Feels sore when she has to go.  She just went to the bathroom before coming, went a little bit, and it hurt when she had to go.  She did take MOM last night.  Doesn't feel like she's emptied out.  Routinely, takes 2 senna at bedtime.  Had not been having any spells of constipation up to this point.  She says she will squeeze and squeeze.  She did have the small bm this am just before this per her daughter.  Doesn't really feel bloated.  She says she is not passing gas either.  Eating well, drinking well.    After we completed the visit, her daughter arrived and informed me of several changes going on with her mom: 1.  Pt did have a significant BM the size of a potato this am.  She does not want anything but prune juice used tonight for the bowel problem (it's unclear she's having a constipation problem to me).   2.  Her memory is worsening as in her history-providing ability is declining.  She is having more apraxia issues (difficulty with her adls).  3.  She has concerns about how her mom and dad will get around together if her dad gets a power chair b/c she will  not be able to have one and operate it safely.   4.  She asked if we cans top the xanax at hs and see if it helps.  Of course.  I wouldn't have started it.  Try melatonin 5mg  at hs instead. 5.  She is concerned about the two areas on her anterior shins not healing and requests zinc and vitamin c be added for a short course. 6.  She is going to psychology only every other week now.  Past Medical History:  Diagnosis Date  . Abnormality of gait 11/15/2012  . Acute upper respiratory infections of unspecified site 04/28/2012  . Anxiety state, unspecified 11/10/2011  . Backache, unspecified 11/10/2011  . Depressive disorder, not elsewhere classified 11/10/2011  . Edema 02/09/2012  . Lumbago 12/27/2011  . Memory loss 12/18/2010  . Osteoarthrosis, unspecified whether generalized or localized, unspecified site 11/10/2011  . Other and unspecified hyperlipidemia   . Other specified erythematous condition(695.89) 01/24/2012  . Pain in joint, shoulder region   . Rash and other nonspecific skin eruption 12/27/2011  . Reflux esophagitis 11/10/2011  . Spinal stenosis, unspecified region other than cervical   . Unspecified constipation 11/10/2011  . Unspecified essential hypertension   . Unspecified hypothyroidism 11/10/2011  . Unspecified hypothyroidism   . Unspecified urinary  incontinence 11/10/2011    Past Surgical History:  Procedure Laterality Date  . BACK SURGERY     x3  . CATARACT EXTRACTION W/ INTRAOCULAR LENS  IMPLANT, BILATERAL  2009-2010  . ROTATOR CUFF REPAIR Right   . TOTAL ABDOMINAL HYSTERECTOMY W/ BILATERAL SALPINGOOPHORECTOMY  1972    Allergies  Allergen Reactions  . Cozaar [Losartan Potassium] Swelling  . Doxycycline   . Lamictal [Lamotrigine]   . Nsaids   . Neurontin [Gabapentin]       Medication List       Accurate as of 11/26/15 11:59 PM. Always use your most recent med list.          ALPRAZolam 0.25 MG tablet Commonly known as:  XANAX Take one tablet by mouth at  bedtime for anxiety   ARIPiprazole 2 MG tablet Commonly known as:  ABILIFY Take 2 mg by mouth daily.   busPIRone 30 MG tablet Commonly known as:  BUSPAR Take 30 mg by mouth 2 (two) times daily.   CALCIUM 600+D3 600-400 MG-UNIT tablet Generic drug:  Calcium Carbonate-Vitamin D Take 1 tablet by mouth daily. Take one tablet  daily   CARRAVITE PO Take by mouth. Take one tablet daily   cetaphil lotion Apply 1 application topically. Apply twice a day as needed for dry, scaly skin   FLUORIDEX DAILY DEFENSE 1.1 % Gel dental gel Generic drug:  sodium fluoride Place 1 application onto teeth at bedtime.   hydrocortisone 1 % lotion Apply 1 application topically. To face at bedtime   ketoconazole 2 % shampoo Commonly known as:  NIZORAL APPLY TO SCALP AND LET SIT FOR 5 MINUTESTHEN RINSE ONCE A DAY ON TUESDAY THURSDAY   levothyroxine 75 MCG tablet Commonly known as:  SYNTHROID, LEVOTHROID TAKE 1/2 TABLET EVERY MORNING 30 MINUTESBEFORE BREAKFAST FOR THYROID   Melatonin 3 MG Caps Take by mouth. Take one capsule daily   methadone 5 MG tablet Commonly known as:  DOLOPHINE Take 7.5 mg by mouth every evening. Take 5 mg twice daily   metoprolol succinate 25 MG 24 hr tablet Commonly known as:  TOPROL-XL TAKE ONE TABLET EACH DAY FOR BLOOD PRESSURE   omeprazole 20 MG capsule Commonly known as:  PRILOSEC ONE CAPSULE TWICE A DAY TO REDUCE STPMACH ACID   sertraline 50 MG tablet Commonly known as:  ZOLOFT TAKE ONE TABLET EACH DAY FOR DEPRESSION   VOLTAREN 1 % Gel Generic drug:  diclofenac sodium Apply 2 g topically as needed.      Review of Systems:  Review of Systems  Constitutional: Negative for chills and fever.  HENT: Negative for congestion.   Eyes:       Glasses  Respiratory: Negative for cough and shortness of breath.   Cardiovascular: Negative for chest pain and leg swelling.  Gastrointestinal: Positive for constipation. Negative for abdominal pain, blood in stool,  diarrhea and melena.  Genitourinary: Negative for dysuria.  Musculoskeletal: Positive for back pain and falls.       Pain is controlled  Skin: Negative for rash.       Two sores on shins still present  Neurological: Negative for dizziness, loss of consciousness and headaches.  Endo/Heme/Allergies: Bruises/bleeds easily.  Psychiatric/Behavioral: Positive for memory loss. Negative for depression. The patient is nervous/anxious and has insomnia.     Health Maintenance  Topic Date Due  . TETANUS/TDAP  05/30/1948  . DEXA SCAN  05/31/1994  . INFLUENZA VACCINE  10/14/2015  . ZOSTAVAX  Addressed  . PNA vac Low Risk Adult  Completed    Physical Exam: Vitals:   11/26/15 1357  BP: 120/60  Pulse: 74  Temp: 99.1 F (37.3 C)  TempSrc: Oral  SpO2: 94%  Weight: 144 lb (65.3 kg)  Height: 4\' 10"  (1.473 m)   Body mass index is 30.1 kg/m. Physical Exam  Constitutional: She appears well-developed and well-nourished. No distress.  Cardiovascular: Normal rate, regular rhythm, normal heart sounds and intact distal pulses.   Pulmonary/Chest: Effort normal and breath sounds normal. No respiratory distress.  Abdominal: Soft. Bowel sounds are normal. She exhibits no distension and no mass. There is no tenderness. There is no rebound and no guarding. No hernia.  Genitourinary: Rectal exam shows guaiac negative stool.  Musculoskeletal: Normal range of motion.  Neurological: She is alert.  Oriented to person and place not precise date, time  Skin: Skin is warm and dry. Capillary refill takes less than 2 seconds.  Two excoriations with eschar on bilateral shins slowly healing  Psychiatric: She has a normal mood and affect.  Kept repeating the whole visit about how her daughter was going to come again and again    Labs reviewed: Basic Metabolic Panel:  Recent Labs  12/26/14 07/17/15 1251  NA 135* 134*  K 4.4 4.2  BUN 15 11  CREATININE 1.0 0.9  TSH 0.40* 0.77   Liver Function  Tests:  Recent Labs  12/26/14 07/17/15 1251  AST 17 13  ALT 11 8  ALKPHOS 58 51   No results for input(s): LIPASE, AMYLASE in the last 8760 hours. No results for input(s): AMMONIA in the last 8760 hours. CBC:  Recent Labs  12/26/14 07/17/15 1251  WBC 7.2 7.0  HGB 11.7* 11.5*  HCT 35* 37  PLT 199 191   Lipid Panel:  Recent Labs  12/26/14  CHOL 227*  HDL 24*  LDLCALC 164  TRIG 181*   Lab Results  Component Value Date   HGBA1C 6.2 12/26/2014    Assessment/Plan 1. Slow transit constipation -cont use of MOM prn and prune juice tonight if she does not have another bm by after dinnertime  2. Chronic pain syndrome -controlled with methadone regimen--attempts to reduce were unsuccessful  3. Skin excoriation -bilateral shins, very slowly healing -will add the vit c and zinc in hopes it will help  4. Insomnia -d/c xanax and begin melatonin 5mg  po qhs  5. Depression -cont psychology visits biweekly and zoloft, abilify and buspar (? Consider tapering buspar also to see if it helps cognition)  Labs/tests ordered: No orders of the defined types were placed in this encounter.  Next appt:  3 mos med mgt  [place] Soy L. Mickie Kozikowski, D.O. [facility] [address], [place] [address] Cell Phone (Mon-Fri 8am-5pm):  [phone] On Call:  [phone] & follow prompts after 5pm & weekends Office Phone:  [phone] Office Fax:  [phone]

## 2015-11-27 DIAGNOSIS — B351 Tinea unguium: Secondary | ICD-10-CM | POA: Diagnosis not present

## 2015-11-27 DIAGNOSIS — R2689 Other abnormalities of gait and mobility: Secondary | ICD-10-CM | POA: Diagnosis not present

## 2015-11-27 DIAGNOSIS — M4800 Spinal stenosis, site unspecified: Secondary | ICD-10-CM | POA: Diagnosis not present

## 2015-11-27 DIAGNOSIS — R2681 Unsteadiness on feet: Secondary | ICD-10-CM | POA: Diagnosis not present

## 2015-11-27 DIAGNOSIS — M79605 Pain in left leg: Secondary | ICD-10-CM | POA: Diagnosis not present

## 2015-11-27 DIAGNOSIS — L89892 Pressure ulcer of other site, stage 2: Secondary | ICD-10-CM | POA: Diagnosis not present

## 2015-11-27 DIAGNOSIS — R278 Other lack of coordination: Secondary | ICD-10-CM | POA: Diagnosis not present

## 2015-11-27 DIAGNOSIS — L84 Corns and callosities: Secondary | ICD-10-CM | POA: Diagnosis not present

## 2015-11-27 DIAGNOSIS — M25562 Pain in left knee: Secondary | ICD-10-CM | POA: Diagnosis not present

## 2015-11-29 MED ORDER — MELATONIN 5 MG PO TABS: 5.0000 mg | ORAL_TABLET | Freq: Every day | ORAL | 5 refills | Status: AC

## 2015-12-01 DIAGNOSIS — M79605 Pain in left leg: Secondary | ICD-10-CM | POA: Diagnosis not present

## 2015-12-01 DIAGNOSIS — M25562 Pain in left knee: Secondary | ICD-10-CM | POA: Diagnosis not present

## 2015-12-01 DIAGNOSIS — R2681 Unsteadiness on feet: Secondary | ICD-10-CM | POA: Diagnosis not present

## 2015-12-01 DIAGNOSIS — R278 Other lack of coordination: Secondary | ICD-10-CM | POA: Diagnosis not present

## 2015-12-01 DIAGNOSIS — R2689 Other abnormalities of gait and mobility: Secondary | ICD-10-CM | POA: Diagnosis not present

## 2015-12-01 DIAGNOSIS — M4800 Spinal stenosis, site unspecified: Secondary | ICD-10-CM | POA: Diagnosis not present

## 2015-12-19 ENCOUNTER — Other Ambulatory Visit: Payer: Self-pay | Admitting: Internal Medicine

## 2015-12-23 DIAGNOSIS — F411 Generalized anxiety disorder: Secondary | ICD-10-CM | POA: Diagnosis not present

## 2015-12-23 DIAGNOSIS — F331 Major depressive disorder, recurrent, moderate: Secondary | ICD-10-CM | POA: Diagnosis not present

## 2015-12-26 ENCOUNTER — Encounter: Payer: Self-pay | Admitting: Internal Medicine

## 2015-12-30 DIAGNOSIS — R269 Unspecified abnormalities of gait and mobility: Secondary | ICD-10-CM | POA: Diagnosis not present

## 2015-12-30 DIAGNOSIS — F0390 Unspecified dementia without behavioral disturbance: Secondary | ICD-10-CM | POA: Diagnosis not present

## 2015-12-30 DIAGNOSIS — G894 Chronic pain syndrome: Secondary | ICD-10-CM | POA: Diagnosis not present

## 2015-12-30 DIAGNOSIS — I10 Essential (primary) hypertension: Secondary | ICD-10-CM | POA: Diagnosis not present

## 2016-01-05 DIAGNOSIS — B351 Tinea unguium: Secondary | ICD-10-CM | POA: Diagnosis not present

## 2016-01-05 DIAGNOSIS — L84 Corns and callosities: Secondary | ICD-10-CM | POA: Diagnosis not present

## 2016-01-06 DIAGNOSIS — M6281 Muscle weakness (generalized): Secondary | ICD-10-CM | POA: Diagnosis not present

## 2016-01-06 DIAGNOSIS — R2681 Unsteadiness on feet: Secondary | ICD-10-CM | POA: Diagnosis not present

## 2016-01-06 DIAGNOSIS — Z9181 History of falling: Secondary | ICD-10-CM | POA: Diagnosis not present

## 2016-01-07 DIAGNOSIS — Z9181 History of falling: Secondary | ICD-10-CM | POA: Diagnosis not present

## 2016-01-07 DIAGNOSIS — R2681 Unsteadiness on feet: Secondary | ICD-10-CM | POA: Diagnosis not present

## 2016-01-07 DIAGNOSIS — M6281 Muscle weakness (generalized): Secondary | ICD-10-CM | POA: Diagnosis not present

## 2016-01-08 DIAGNOSIS — M6281 Muscle weakness (generalized): Secondary | ICD-10-CM | POA: Diagnosis not present

## 2016-01-08 DIAGNOSIS — I10 Essential (primary) hypertension: Secondary | ICD-10-CM | POA: Diagnosis not present

## 2016-01-08 DIAGNOSIS — R2681 Unsteadiness on feet: Secondary | ICD-10-CM | POA: Diagnosis not present

## 2016-01-08 DIAGNOSIS — Z9181 History of falling: Secondary | ICD-10-CM | POA: Diagnosis not present

## 2016-01-09 DIAGNOSIS — M6281 Muscle weakness (generalized): Secondary | ICD-10-CM | POA: Diagnosis not present

## 2016-01-09 DIAGNOSIS — R2681 Unsteadiness on feet: Secondary | ICD-10-CM | POA: Diagnosis not present

## 2016-01-09 DIAGNOSIS — Z9181 History of falling: Secondary | ICD-10-CM | POA: Diagnosis not present

## 2016-01-12 DIAGNOSIS — Z9181 History of falling: Secondary | ICD-10-CM | POA: Diagnosis not present

## 2016-01-12 DIAGNOSIS — R2681 Unsteadiness on feet: Secondary | ICD-10-CM | POA: Diagnosis not present

## 2016-01-12 DIAGNOSIS — M6281 Muscle weakness (generalized): Secondary | ICD-10-CM | POA: Diagnosis not present

## 2016-01-14 DIAGNOSIS — Z9181 History of falling: Secondary | ICD-10-CM | POA: Diagnosis not present

## 2016-01-14 DIAGNOSIS — M6281 Muscle weakness (generalized): Secondary | ICD-10-CM | POA: Diagnosis not present

## 2016-01-14 DIAGNOSIS — R2681 Unsteadiness on feet: Secondary | ICD-10-CM | POA: Diagnosis not present

## 2016-01-15 DIAGNOSIS — M6281 Muscle weakness (generalized): Secondary | ICD-10-CM | POA: Diagnosis not present

## 2016-01-15 DIAGNOSIS — R2681 Unsteadiness on feet: Secondary | ICD-10-CM | POA: Diagnosis not present

## 2016-01-15 DIAGNOSIS — Z9181 History of falling: Secondary | ICD-10-CM | POA: Diagnosis not present

## 2016-01-16 DIAGNOSIS — M6281 Muscle weakness (generalized): Secondary | ICD-10-CM | POA: Diagnosis not present

## 2016-01-16 DIAGNOSIS — R2681 Unsteadiness on feet: Secondary | ICD-10-CM | POA: Diagnosis not present

## 2016-01-16 DIAGNOSIS — Z9181 History of falling: Secondary | ICD-10-CM | POA: Diagnosis not present

## 2016-01-19 DIAGNOSIS — R2681 Unsteadiness on feet: Secondary | ICD-10-CM | POA: Diagnosis not present

## 2016-01-19 DIAGNOSIS — M6281 Muscle weakness (generalized): Secondary | ICD-10-CM | POA: Diagnosis not present

## 2016-01-19 DIAGNOSIS — Z9181 History of falling: Secondary | ICD-10-CM | POA: Diagnosis not present

## 2016-01-20 DIAGNOSIS — Z9181 History of falling: Secondary | ICD-10-CM | POA: Diagnosis not present

## 2016-01-20 DIAGNOSIS — R2681 Unsteadiness on feet: Secondary | ICD-10-CM | POA: Diagnosis not present

## 2016-01-20 DIAGNOSIS — M6281 Muscle weakness (generalized): Secondary | ICD-10-CM | POA: Diagnosis not present

## 2016-01-21 DIAGNOSIS — R2681 Unsteadiness on feet: Secondary | ICD-10-CM | POA: Diagnosis not present

## 2016-01-21 DIAGNOSIS — M6281 Muscle weakness (generalized): Secondary | ICD-10-CM | POA: Diagnosis not present

## 2016-01-21 DIAGNOSIS — Z9181 History of falling: Secondary | ICD-10-CM | POA: Diagnosis not present

## 2016-01-22 DIAGNOSIS — R2681 Unsteadiness on feet: Secondary | ICD-10-CM | POA: Diagnosis not present

## 2016-01-22 DIAGNOSIS — Z9181 History of falling: Secondary | ICD-10-CM | POA: Diagnosis not present

## 2016-01-22 DIAGNOSIS — M6281 Muscle weakness (generalized): Secondary | ICD-10-CM | POA: Diagnosis not present

## 2016-01-23 DIAGNOSIS — M6281 Muscle weakness (generalized): Secondary | ICD-10-CM | POA: Diagnosis not present

## 2016-01-23 DIAGNOSIS — R2681 Unsteadiness on feet: Secondary | ICD-10-CM | POA: Diagnosis not present

## 2016-01-23 DIAGNOSIS — Z9181 History of falling: Secondary | ICD-10-CM | POA: Diagnosis not present

## 2016-01-26 DIAGNOSIS — M6281 Muscle weakness (generalized): Secondary | ICD-10-CM | POA: Diagnosis not present

## 2016-01-26 DIAGNOSIS — R2681 Unsteadiness on feet: Secondary | ICD-10-CM | POA: Diagnosis not present

## 2016-01-26 DIAGNOSIS — Z9181 History of falling: Secondary | ICD-10-CM | POA: Diagnosis not present

## 2016-01-27 DIAGNOSIS — I10 Essential (primary) hypertension: Secondary | ICD-10-CM | POA: Diagnosis not present

## 2016-01-27 DIAGNOSIS — G894 Chronic pain syndrome: Secondary | ICD-10-CM | POA: Diagnosis not present

## 2016-01-27 DIAGNOSIS — R269 Unspecified abnormalities of gait and mobility: Secondary | ICD-10-CM | POA: Diagnosis not present

## 2016-01-27 DIAGNOSIS — F0390 Unspecified dementia without behavioral disturbance: Secondary | ICD-10-CM | POA: Diagnosis not present

## 2016-01-27 DIAGNOSIS — B349 Viral infection, unspecified: Secondary | ICD-10-CM | POA: Diagnosis not present

## 2016-01-28 DIAGNOSIS — Z9181 History of falling: Secondary | ICD-10-CM | POA: Diagnosis not present

## 2016-01-28 DIAGNOSIS — R2681 Unsteadiness on feet: Secondary | ICD-10-CM | POA: Diagnosis not present

## 2016-01-28 DIAGNOSIS — M6281 Muscle weakness (generalized): Secondary | ICD-10-CM | POA: Diagnosis not present

## 2016-01-29 DIAGNOSIS — Z9181 History of falling: Secondary | ICD-10-CM | POA: Diagnosis not present

## 2016-01-29 DIAGNOSIS — M6281 Muscle weakness (generalized): Secondary | ICD-10-CM | POA: Diagnosis not present

## 2016-01-29 DIAGNOSIS — R2681 Unsteadiness on feet: Secondary | ICD-10-CM | POA: Diagnosis not present

## 2016-01-30 DIAGNOSIS — R2681 Unsteadiness on feet: Secondary | ICD-10-CM | POA: Diagnosis not present

## 2016-01-30 DIAGNOSIS — Z9181 History of falling: Secondary | ICD-10-CM | POA: Diagnosis not present

## 2016-01-30 DIAGNOSIS — M6281 Muscle weakness (generalized): Secondary | ICD-10-CM | POA: Diagnosis not present

## 2016-02-02 DIAGNOSIS — M6281 Muscle weakness (generalized): Secondary | ICD-10-CM | POA: Diagnosis not present

## 2016-02-02 DIAGNOSIS — I10 Essential (primary) hypertension: Secondary | ICD-10-CM | POA: Diagnosis not present

## 2016-02-02 DIAGNOSIS — Z9181 History of falling: Secondary | ICD-10-CM | POA: Diagnosis not present

## 2016-02-02 DIAGNOSIS — R2681 Unsteadiness on feet: Secondary | ICD-10-CM | POA: Diagnosis not present

## 2016-02-03 DIAGNOSIS — Z9181 History of falling: Secondary | ICD-10-CM | POA: Diagnosis not present

## 2016-02-03 DIAGNOSIS — M6281 Muscle weakness (generalized): Secondary | ICD-10-CM | POA: Diagnosis not present

## 2016-02-03 DIAGNOSIS — R2681 Unsteadiness on feet: Secondary | ICD-10-CM | POA: Diagnosis not present

## 2016-02-04 DIAGNOSIS — Z9181 History of falling: Secondary | ICD-10-CM | POA: Diagnosis not present

## 2016-02-04 DIAGNOSIS — R2681 Unsteadiness on feet: Secondary | ICD-10-CM | POA: Diagnosis not present

## 2016-02-04 DIAGNOSIS — M6281 Muscle weakness (generalized): Secondary | ICD-10-CM | POA: Diagnosis not present

## 2016-02-06 DIAGNOSIS — Z9181 History of falling: Secondary | ICD-10-CM | POA: Diagnosis not present

## 2016-02-06 DIAGNOSIS — R2681 Unsteadiness on feet: Secondary | ICD-10-CM | POA: Diagnosis not present

## 2016-02-06 DIAGNOSIS — M6281 Muscle weakness (generalized): Secondary | ICD-10-CM | POA: Diagnosis not present

## 2016-02-09 ENCOUNTER — Other Ambulatory Visit: Payer: Self-pay | Admitting: Internal Medicine

## 2016-02-09 DIAGNOSIS — R2681 Unsteadiness on feet: Secondary | ICD-10-CM | POA: Diagnosis not present

## 2016-02-09 DIAGNOSIS — Z9181 History of falling: Secondary | ICD-10-CM | POA: Diagnosis not present

## 2016-02-09 DIAGNOSIS — M6281 Muscle weakness (generalized): Secondary | ICD-10-CM | POA: Diagnosis not present

## 2016-02-10 DIAGNOSIS — Z9181 History of falling: Secondary | ICD-10-CM | POA: Diagnosis not present

## 2016-02-10 DIAGNOSIS — R2681 Unsteadiness on feet: Secondary | ICD-10-CM | POA: Diagnosis not present

## 2016-02-10 DIAGNOSIS — M6281 Muscle weakness (generalized): Secondary | ICD-10-CM | POA: Diagnosis not present

## 2016-02-11 DIAGNOSIS — Z9181 History of falling: Secondary | ICD-10-CM | POA: Diagnosis not present

## 2016-02-11 DIAGNOSIS — M6281 Muscle weakness (generalized): Secondary | ICD-10-CM | POA: Diagnosis not present

## 2016-02-11 DIAGNOSIS — R2681 Unsteadiness on feet: Secondary | ICD-10-CM | POA: Diagnosis not present

## 2016-02-13 DIAGNOSIS — R2681 Unsteadiness on feet: Secondary | ICD-10-CM | POA: Diagnosis not present

## 2016-02-13 DIAGNOSIS — M6281 Muscle weakness (generalized): Secondary | ICD-10-CM | POA: Diagnosis not present

## 2016-02-13 DIAGNOSIS — Z9181 History of falling: Secondary | ICD-10-CM | POA: Diagnosis not present

## 2016-02-16 DIAGNOSIS — Z9181 History of falling: Secondary | ICD-10-CM | POA: Diagnosis not present

## 2016-02-16 DIAGNOSIS — M6281 Muscle weakness (generalized): Secondary | ICD-10-CM | POA: Diagnosis not present

## 2016-02-16 DIAGNOSIS — R2681 Unsteadiness on feet: Secondary | ICD-10-CM | POA: Diagnosis not present

## 2016-02-17 DIAGNOSIS — F331 Major depressive disorder, recurrent, moderate: Secondary | ICD-10-CM | POA: Diagnosis not present

## 2016-02-17 DIAGNOSIS — F411 Generalized anxiety disorder: Secondary | ICD-10-CM | POA: Diagnosis not present

## 2016-02-18 DIAGNOSIS — R2681 Unsteadiness on feet: Secondary | ICD-10-CM | POA: Diagnosis not present

## 2016-02-18 DIAGNOSIS — M6281 Muscle weakness (generalized): Secondary | ICD-10-CM | POA: Diagnosis not present

## 2016-02-18 DIAGNOSIS — Z9181 History of falling: Secondary | ICD-10-CM | POA: Diagnosis not present

## 2016-02-19 DIAGNOSIS — M6281 Muscle weakness (generalized): Secondary | ICD-10-CM | POA: Diagnosis not present

## 2016-02-19 DIAGNOSIS — R2681 Unsteadiness on feet: Secondary | ICD-10-CM | POA: Diagnosis not present

## 2016-02-19 DIAGNOSIS — Z9181 History of falling: Secondary | ICD-10-CM | POA: Diagnosis not present

## 2016-02-20 ENCOUNTER — Telehealth: Payer: Self-pay | Admitting: *Deleted

## 2016-02-20 DIAGNOSIS — M6281 Muscle weakness (generalized): Secondary | ICD-10-CM | POA: Diagnosis not present

## 2016-02-20 DIAGNOSIS — Z9181 History of falling: Secondary | ICD-10-CM | POA: Diagnosis not present

## 2016-02-20 DIAGNOSIS — R2681 Unsteadiness on feet: Secondary | ICD-10-CM | POA: Diagnosis not present

## 2016-02-20 NOTE — Telephone Encounter (Signed)
Kelly Irwin with [facility] called and stated that you had written a letter for patient and they have some questions regarding her medical condition that they need to clarify with you. I tried to help him but he stated that he had to speak with you. Wants you to call # [phone]

## 2016-02-23 DIAGNOSIS — M6281 Muscle weakness (generalized): Secondary | ICD-10-CM | POA: Diagnosis not present

## 2016-02-23 DIAGNOSIS — R2681 Unsteadiness on feet: Secondary | ICD-10-CM | POA: Diagnosis not present

## 2016-02-23 DIAGNOSIS — Z9181 History of falling: Secondary | ICD-10-CM | POA: Diagnosis not present

## 2016-02-24 DIAGNOSIS — M6281 Muscle weakness (generalized): Secondary | ICD-10-CM | POA: Diagnosis not present

## 2016-02-24 DIAGNOSIS — Z9181 History of falling: Secondary | ICD-10-CM | POA: Diagnosis not present

## 2016-02-24 DIAGNOSIS — R2681 Unsteadiness on feet: Secondary | ICD-10-CM | POA: Diagnosis not present

## 2016-02-26 DIAGNOSIS — M6281 Muscle weakness (generalized): Secondary | ICD-10-CM | POA: Diagnosis not present

## 2016-02-26 DIAGNOSIS — Z9181 History of falling: Secondary | ICD-10-CM | POA: Diagnosis not present

## 2016-02-26 DIAGNOSIS — R2681 Unsteadiness on feet: Secondary | ICD-10-CM | POA: Diagnosis not present

## 2016-02-26 NOTE — Telephone Encounter (Signed)
He reached me by my cell phone.  This was addressed.

## 2016-02-27 DIAGNOSIS — R2681 Unsteadiness on feet: Secondary | ICD-10-CM | POA: Diagnosis not present

## 2016-02-27 DIAGNOSIS — Z9181 History of falling: Secondary | ICD-10-CM | POA: Diagnosis not present

## 2016-02-27 DIAGNOSIS — M6281 Muscle weakness (generalized): Secondary | ICD-10-CM | POA: Diagnosis not present

## 2016-03-01 DIAGNOSIS — R269 Unspecified abnormalities of gait and mobility: Secondary | ICD-10-CM | POA: Diagnosis not present

## 2016-03-01 DIAGNOSIS — I10 Essential (primary) hypertension: Secondary | ICD-10-CM | POA: Diagnosis not present

## 2016-03-01 DIAGNOSIS — G894 Chronic pain syndrome: Secondary | ICD-10-CM | POA: Diagnosis not present

## 2016-03-01 DIAGNOSIS — R2681 Unsteadiness on feet: Secondary | ICD-10-CM | POA: Diagnosis not present

## 2016-03-01 DIAGNOSIS — B372 Candidiasis of skin and nail: Secondary | ICD-10-CM | POA: Diagnosis not present

## 2016-03-01 DIAGNOSIS — Z9181 History of falling: Secondary | ICD-10-CM | POA: Diagnosis not present

## 2016-03-01 DIAGNOSIS — M6281 Muscle weakness (generalized): Secondary | ICD-10-CM | POA: Diagnosis not present

## 2016-03-03 DIAGNOSIS — R2681 Unsteadiness on feet: Secondary | ICD-10-CM | POA: Diagnosis not present

## 2016-03-03 DIAGNOSIS — M6281 Muscle weakness (generalized): Secondary | ICD-10-CM | POA: Diagnosis not present

## 2016-03-03 DIAGNOSIS — Z9181 History of falling: Secondary | ICD-10-CM | POA: Diagnosis not present

## 2016-03-04 DIAGNOSIS — R2681 Unsteadiness on feet: Secondary | ICD-10-CM | POA: Diagnosis not present

## 2016-03-04 DIAGNOSIS — M6281 Muscle weakness (generalized): Secondary | ICD-10-CM | POA: Diagnosis not present

## 2016-03-04 DIAGNOSIS — Z9181 History of falling: Secondary | ICD-10-CM | POA: Diagnosis not present

## 2016-03-05 DIAGNOSIS — M6281 Muscle weakness (generalized): Secondary | ICD-10-CM | POA: Diagnosis not present

## 2016-03-05 DIAGNOSIS — R2681 Unsteadiness on feet: Secondary | ICD-10-CM | POA: Diagnosis not present

## 2016-03-05 DIAGNOSIS — Z9181 History of falling: Secondary | ICD-10-CM | POA: Diagnosis not present

## 2016-03-10 DIAGNOSIS — R269 Unspecified abnormalities of gait and mobility: Secondary | ICD-10-CM | POA: Diagnosis not present

## 2016-03-10 DIAGNOSIS — G894 Chronic pain syndrome: Secondary | ICD-10-CM | POA: Diagnosis not present

## 2016-03-11 DIAGNOSIS — R2681 Unsteadiness on feet: Secondary | ICD-10-CM | POA: Diagnosis not present

## 2016-03-11 DIAGNOSIS — M6281 Muscle weakness (generalized): Secondary | ICD-10-CM | POA: Diagnosis not present

## 2016-03-11 DIAGNOSIS — Z9181 History of falling: Secondary | ICD-10-CM | POA: Diagnosis not present

## 2016-03-12 DIAGNOSIS — R2681 Unsteadiness on feet: Secondary | ICD-10-CM | POA: Diagnosis not present

## 2016-03-12 DIAGNOSIS — M6281 Muscle weakness (generalized): Secondary | ICD-10-CM | POA: Diagnosis not present

## 2016-03-12 DIAGNOSIS — Z9181 History of falling: Secondary | ICD-10-CM | POA: Diagnosis not present

## 2016-03-18 DIAGNOSIS — Z9181 History of falling: Secondary | ICD-10-CM | POA: Diagnosis not present

## 2016-03-18 DIAGNOSIS — R2681 Unsteadiness on feet: Secondary | ICD-10-CM | POA: Diagnosis not present

## 2016-03-18 DIAGNOSIS — M6281 Muscle weakness (generalized): Secondary | ICD-10-CM | POA: Diagnosis not present

## 2016-03-19 DIAGNOSIS — Z9181 History of falling: Secondary | ICD-10-CM | POA: Diagnosis not present

## 2016-03-19 DIAGNOSIS — R2681 Unsteadiness on feet: Secondary | ICD-10-CM | POA: Diagnosis not present

## 2016-03-19 DIAGNOSIS — M6281 Muscle weakness (generalized): Secondary | ICD-10-CM | POA: Diagnosis not present

## 2016-03-22 ENCOUNTER — Other Ambulatory Visit: Payer: Self-pay | Admitting: Internal Medicine

## 2016-03-22 DIAGNOSIS — R2681 Unsteadiness on feet: Secondary | ICD-10-CM | POA: Diagnosis not present

## 2016-03-22 DIAGNOSIS — Z9181 History of falling: Secondary | ICD-10-CM | POA: Diagnosis not present

## 2016-03-22 DIAGNOSIS — M6281 Muscle weakness (generalized): Secondary | ICD-10-CM | POA: Diagnosis not present

## 2016-03-23 DIAGNOSIS — B351 Tinea unguium: Secondary | ICD-10-CM | POA: Diagnosis not present

## 2016-03-23 DIAGNOSIS — M6281 Muscle weakness (generalized): Secondary | ICD-10-CM | POA: Diagnosis not present

## 2016-03-23 DIAGNOSIS — R2681 Unsteadiness on feet: Secondary | ICD-10-CM | POA: Diagnosis not present

## 2016-03-23 DIAGNOSIS — Z9181 History of falling: Secondary | ICD-10-CM | POA: Diagnosis not present

## 2016-03-23 DIAGNOSIS — L84 Corns and callosities: Secondary | ICD-10-CM | POA: Diagnosis not present

## 2016-03-24 DIAGNOSIS — R2681 Unsteadiness on feet: Secondary | ICD-10-CM | POA: Diagnosis not present

## 2016-03-24 DIAGNOSIS — M6281 Muscle weakness (generalized): Secondary | ICD-10-CM | POA: Diagnosis not present

## 2016-03-24 DIAGNOSIS — Z9181 History of falling: Secondary | ICD-10-CM | POA: Diagnosis not present

## 2016-03-25 DIAGNOSIS — M6281 Muscle weakness (generalized): Secondary | ICD-10-CM | POA: Diagnosis not present

## 2016-03-25 DIAGNOSIS — R2681 Unsteadiness on feet: Secondary | ICD-10-CM | POA: Diagnosis not present

## 2016-03-25 DIAGNOSIS — Z9181 History of falling: Secondary | ICD-10-CM | POA: Diagnosis not present

## 2016-03-26 DIAGNOSIS — Z9181 History of falling: Secondary | ICD-10-CM | POA: Diagnosis not present

## 2016-03-26 DIAGNOSIS — M6281 Muscle weakness (generalized): Secondary | ICD-10-CM | POA: Diagnosis not present

## 2016-03-26 DIAGNOSIS — R2681 Unsteadiness on feet: Secondary | ICD-10-CM | POA: Diagnosis not present

## 2016-03-29 DIAGNOSIS — M6281 Muscle weakness (generalized): Secondary | ICD-10-CM | POA: Diagnosis not present

## 2016-03-29 DIAGNOSIS — R2681 Unsteadiness on feet: Secondary | ICD-10-CM | POA: Diagnosis not present

## 2016-03-29 DIAGNOSIS — Z9181 History of falling: Secondary | ICD-10-CM | POA: Diagnosis not present

## 2016-03-30 DIAGNOSIS — I10 Essential (primary) hypertension: Secondary | ICD-10-CM | POA: Diagnosis not present

## 2016-03-30 DIAGNOSIS — R52 Pain, unspecified: Secondary | ICD-10-CM | POA: Diagnosis not present

## 2016-03-30 DIAGNOSIS — B372 Candidiasis of skin and nail: Secondary | ICD-10-CM | POA: Diagnosis not present

## 2016-03-30 DIAGNOSIS — R269 Unspecified abnormalities of gait and mobility: Secondary | ICD-10-CM | POA: Diagnosis not present

## 2016-03-30 DIAGNOSIS — L03119 Cellulitis of unspecified part of limb: Secondary | ICD-10-CM | POA: Diagnosis not present

## 2016-04-02 DIAGNOSIS — Z9181 History of falling: Secondary | ICD-10-CM | POA: Diagnosis not present

## 2016-04-02 DIAGNOSIS — R2681 Unsteadiness on feet: Secondary | ICD-10-CM | POA: Diagnosis not present

## 2016-04-02 DIAGNOSIS — M6281 Muscle weakness (generalized): Secondary | ICD-10-CM | POA: Diagnosis not present

## 2016-04-03 DIAGNOSIS — Z9181 History of falling: Secondary | ICD-10-CM | POA: Diagnosis not present

## 2016-04-03 DIAGNOSIS — R0902 Hypoxemia: Secondary | ICD-10-CM | POA: Diagnosis not present

## 2016-04-03 DIAGNOSIS — M6281 Muscle weakness (generalized): Secondary | ICD-10-CM | POA: Diagnosis not present

## 2016-04-03 DIAGNOSIS — R2681 Unsteadiness on feet: Secondary | ICD-10-CM | POA: Diagnosis not present

## 2016-04-05 DIAGNOSIS — F419 Anxiety disorder, unspecified: Secondary | ICD-10-CM | POA: Diagnosis not present

## 2016-04-05 DIAGNOSIS — E039 Hypothyroidism, unspecified: Secondary | ICD-10-CM | POA: Diagnosis not present

## 2016-04-05 DIAGNOSIS — R2681 Unsteadiness on feet: Secondary | ICD-10-CM | POA: Diagnosis not present

## 2016-04-05 DIAGNOSIS — R609 Edema, unspecified: Secondary | ICD-10-CM | POA: Diagnosis not present

## 2016-04-05 DIAGNOSIS — R0902 Hypoxemia: Secondary | ICD-10-CM | POA: Diagnosis not present

## 2016-04-05 DIAGNOSIS — I10 Essential (primary) hypertension: Secondary | ICD-10-CM | POA: Diagnosis not present

## 2016-04-05 DIAGNOSIS — Z9181 History of falling: Secondary | ICD-10-CM | POA: Diagnosis not present

## 2016-04-05 DIAGNOSIS — M6281 Muscle weakness (generalized): Secondary | ICD-10-CM | POA: Diagnosis not present

## 2016-04-05 DIAGNOSIS — L03119 Cellulitis of unspecified part of limb: Secondary | ICD-10-CM | POA: Diagnosis not present

## 2016-04-05 DIAGNOSIS — D649 Anemia, unspecified: Secondary | ICD-10-CM | POA: Diagnosis not present

## 2016-04-06 DIAGNOSIS — R0902 Hypoxemia: Secondary | ICD-10-CM | POA: Diagnosis not present

## 2016-04-07 DIAGNOSIS — M6281 Muscle weakness (generalized): Secondary | ICD-10-CM | POA: Diagnosis not present

## 2016-04-07 DIAGNOSIS — R2681 Unsteadiness on feet: Secondary | ICD-10-CM | POA: Diagnosis not present

## 2016-04-07 DIAGNOSIS — Z9181 History of falling: Secondary | ICD-10-CM | POA: Diagnosis not present

## 2016-04-09 DIAGNOSIS — I10 Essential (primary) hypertension: Secondary | ICD-10-CM | POA: Diagnosis not present

## 2016-04-09 DIAGNOSIS — Z9181 History of falling: Secondary | ICD-10-CM | POA: Diagnosis not present

## 2016-04-09 DIAGNOSIS — M6281 Muscle weakness (generalized): Secondary | ICD-10-CM | POA: Diagnosis not present

## 2016-04-09 DIAGNOSIS — R2681 Unsteadiness on feet: Secondary | ICD-10-CM | POA: Diagnosis not present

## 2016-04-10 DIAGNOSIS — I10 Essential (primary) hypertension: Secondary | ICD-10-CM | POA: Diagnosis not present

## 2016-04-13 DIAGNOSIS — R2681 Unsteadiness on feet: Secondary | ICD-10-CM | POA: Diagnosis not present

## 2016-04-13 DIAGNOSIS — M6281 Muscle weakness (generalized): Secondary | ICD-10-CM | POA: Diagnosis not present

## 2016-04-13 DIAGNOSIS — I10 Essential (primary) hypertension: Secondary | ICD-10-CM | POA: Diagnosis not present

## 2016-04-13 DIAGNOSIS — Z9181 History of falling: Secondary | ICD-10-CM | POA: Diagnosis not present

## 2016-04-15 DIAGNOSIS — R2681 Unsteadiness on feet: Secondary | ICD-10-CM | POA: Diagnosis not present

## 2016-04-15 DIAGNOSIS — M6281 Muscle weakness (generalized): Secondary | ICD-10-CM | POA: Diagnosis not present

## 2016-04-15 DIAGNOSIS — Z9181 History of falling: Secondary | ICD-10-CM | POA: Diagnosis not present

## 2016-04-16 DIAGNOSIS — Z9181 History of falling: Secondary | ICD-10-CM | POA: Diagnosis not present

## 2016-04-16 DIAGNOSIS — M6281 Muscle weakness (generalized): Secondary | ICD-10-CM | POA: Diagnosis not present

## 2016-04-16 DIAGNOSIS — R2681 Unsteadiness on feet: Secondary | ICD-10-CM | POA: Diagnosis not present

## 2016-04-19 DIAGNOSIS — I081 Rheumatic disorders of both mitral and tricuspid valves: Secondary | ICD-10-CM | POA: Diagnosis not present

## 2016-04-25 DIAGNOSIS — Z9181 History of falling: Secondary | ICD-10-CM | POA: Diagnosis not present

## 2016-04-25 DIAGNOSIS — R2681 Unsteadiness on feet: Secondary | ICD-10-CM | POA: Diagnosis not present

## 2016-04-25 DIAGNOSIS — M6281 Muscle weakness (generalized): Secondary | ICD-10-CM | POA: Diagnosis not present

## 2016-04-26 DIAGNOSIS — Z9181 History of falling: Secondary | ICD-10-CM | POA: Diagnosis not present

## 2016-04-26 DIAGNOSIS — R2681 Unsteadiness on feet: Secondary | ICD-10-CM | POA: Diagnosis not present

## 2016-04-26 DIAGNOSIS — M6281 Muscle weakness (generalized): Secondary | ICD-10-CM | POA: Diagnosis not present

## 2016-04-27 DIAGNOSIS — M6281 Muscle weakness (generalized): Secondary | ICD-10-CM | POA: Diagnosis not present

## 2016-04-27 DIAGNOSIS — Z9181 History of falling: Secondary | ICD-10-CM | POA: Diagnosis not present

## 2016-04-27 DIAGNOSIS — R2681 Unsteadiness on feet: Secondary | ICD-10-CM | POA: Diagnosis not present

## 2016-04-28 DIAGNOSIS — Z9181 History of falling: Secondary | ICD-10-CM | POA: Diagnosis not present

## 2016-04-28 DIAGNOSIS — M6281 Muscle weakness (generalized): Secondary | ICD-10-CM | POA: Diagnosis not present

## 2016-04-28 DIAGNOSIS — R2681 Unsteadiness on feet: Secondary | ICD-10-CM | POA: Diagnosis not present

## 2016-04-30 DIAGNOSIS — R2681 Unsteadiness on feet: Secondary | ICD-10-CM | POA: Diagnosis not present

## 2016-04-30 DIAGNOSIS — M6281 Muscle weakness (generalized): Secondary | ICD-10-CM | POA: Diagnosis not present

## 2016-04-30 DIAGNOSIS — Z9181 History of falling: Secondary | ICD-10-CM | POA: Diagnosis not present

## 2016-05-03 DIAGNOSIS — R2681 Unsteadiness on feet: Secondary | ICD-10-CM | POA: Diagnosis not present

## 2016-05-03 DIAGNOSIS — M6281 Muscle weakness (generalized): Secondary | ICD-10-CM | POA: Diagnosis not present

## 2016-05-03 DIAGNOSIS — Z9181 History of falling: Secondary | ICD-10-CM | POA: Diagnosis not present

## 2016-05-04 DIAGNOSIS — R2681 Unsteadiness on feet: Secondary | ICD-10-CM | POA: Diagnosis not present

## 2016-05-04 DIAGNOSIS — Z9181 History of falling: Secondary | ICD-10-CM | POA: Diagnosis not present

## 2016-05-04 DIAGNOSIS — M6281 Muscle weakness (generalized): Secondary | ICD-10-CM | POA: Diagnosis not present

## 2016-05-05 DIAGNOSIS — E878 Other disorders of electrolyte and fluid balance, not elsewhere classified: Secondary | ICD-10-CM | POA: Diagnosis not present

## 2016-05-06 DIAGNOSIS — G894 Chronic pain syndrome: Secondary | ICD-10-CM | POA: Diagnosis not present

## 2016-05-06 DIAGNOSIS — R0902 Hypoxemia: Secondary | ICD-10-CM | POA: Diagnosis not present

## 2016-05-06 DIAGNOSIS — J449 Chronic obstructive pulmonary disease, unspecified: Secondary | ICD-10-CM | POA: Diagnosis not present

## 2016-05-06 DIAGNOSIS — I10 Essential (primary) hypertension: Secondary | ICD-10-CM | POA: Diagnosis not present

## 2016-05-06 DIAGNOSIS — M6281 Muscle weakness (generalized): Secondary | ICD-10-CM | POA: Diagnosis not present

## 2016-05-06 DIAGNOSIS — R2681 Unsteadiness on feet: Secondary | ICD-10-CM | POA: Diagnosis not present

## 2016-05-06 DIAGNOSIS — Z9181 History of falling: Secondary | ICD-10-CM | POA: Diagnosis not present

## 2016-05-07 DIAGNOSIS — M6281 Muscle weakness (generalized): Secondary | ICD-10-CM | POA: Diagnosis not present

## 2016-05-07 DIAGNOSIS — R2681 Unsteadiness on feet: Secondary | ICD-10-CM | POA: Diagnosis not present

## 2016-05-07 DIAGNOSIS — Z9181 History of falling: Secondary | ICD-10-CM | POA: Diagnosis not present

## 2016-05-10 DIAGNOSIS — Z9181 History of falling: Secondary | ICD-10-CM | POA: Diagnosis not present

## 2016-05-10 DIAGNOSIS — M6281 Muscle weakness (generalized): Secondary | ICD-10-CM | POA: Diagnosis not present

## 2016-05-10 DIAGNOSIS — R2681 Unsteadiness on feet: Secondary | ICD-10-CM | POA: Diagnosis not present

## 2016-05-11 DIAGNOSIS — Z9181 History of falling: Secondary | ICD-10-CM | POA: Diagnosis not present

## 2016-05-11 DIAGNOSIS — R2681 Unsteadiness on feet: Secondary | ICD-10-CM | POA: Diagnosis not present

## 2016-05-11 DIAGNOSIS — M6281 Muscle weakness (generalized): Secondary | ICD-10-CM | POA: Diagnosis not present

## 2016-05-12 DIAGNOSIS — I10 Essential (primary) hypertension: Secondary | ICD-10-CM | POA: Diagnosis not present

## 2016-05-12 DIAGNOSIS — J449 Chronic obstructive pulmonary disease, unspecified: Secondary | ICD-10-CM | POA: Diagnosis not present

## 2016-05-13 DIAGNOSIS — Z9181 History of falling: Secondary | ICD-10-CM | POA: Diagnosis not present

## 2016-05-13 DIAGNOSIS — R2681 Unsteadiness on feet: Secondary | ICD-10-CM | POA: Diagnosis not present

## 2016-05-13 DIAGNOSIS — M6281 Muscle weakness (generalized): Secondary | ICD-10-CM | POA: Diagnosis not present

## 2016-05-14 DIAGNOSIS — R2681 Unsteadiness on feet: Secondary | ICD-10-CM | POA: Diagnosis not present

## 2016-05-14 DIAGNOSIS — M6281 Muscle weakness (generalized): Secondary | ICD-10-CM | POA: Diagnosis not present

## 2016-05-14 DIAGNOSIS — Z9181 History of falling: Secondary | ICD-10-CM | POA: Diagnosis not present

## 2016-05-17 DIAGNOSIS — M6281 Muscle weakness (generalized): Secondary | ICD-10-CM | POA: Diagnosis not present

## 2016-05-17 DIAGNOSIS — R2681 Unsteadiness on feet: Secondary | ICD-10-CM | POA: Diagnosis not present

## 2016-05-17 DIAGNOSIS — Z9181 History of falling: Secondary | ICD-10-CM | POA: Diagnosis not present

## 2016-05-19 DIAGNOSIS — R2681 Unsteadiness on feet: Secondary | ICD-10-CM | POA: Diagnosis not present

## 2016-05-19 DIAGNOSIS — M6281 Muscle weakness (generalized): Secondary | ICD-10-CM | POA: Diagnosis not present

## 2016-05-19 DIAGNOSIS — Z9181 History of falling: Secondary | ICD-10-CM | POA: Diagnosis not present

## 2016-05-25 DIAGNOSIS — M6281 Muscle weakness (generalized): Secondary | ICD-10-CM | POA: Diagnosis not present

## 2016-05-25 DIAGNOSIS — R2681 Unsteadiness on feet: Secondary | ICD-10-CM | POA: Diagnosis not present

## 2016-05-25 DIAGNOSIS — Z9181 History of falling: Secondary | ICD-10-CM | POA: Diagnosis not present

## 2016-05-26 ENCOUNTER — Other Ambulatory Visit: Payer: Self-pay | Admitting: Internal Medicine

## 2016-05-26 DIAGNOSIS — M6281 Muscle weakness (generalized): Secondary | ICD-10-CM | POA: Diagnosis not present

## 2016-05-26 DIAGNOSIS — Z9181 History of falling: Secondary | ICD-10-CM | POA: Diagnosis not present

## 2016-05-26 DIAGNOSIS — R2681 Unsteadiness on feet: Secondary | ICD-10-CM | POA: Diagnosis not present

## 2016-05-28 DIAGNOSIS — M6281 Muscle weakness (generalized): Secondary | ICD-10-CM | POA: Diagnosis not present

## 2016-05-28 DIAGNOSIS — Z9181 History of falling: Secondary | ICD-10-CM | POA: Diagnosis not present

## 2016-05-28 DIAGNOSIS — R2681 Unsteadiness on feet: Secondary | ICD-10-CM | POA: Diagnosis not present

## 2016-05-31 DIAGNOSIS — M6281 Muscle weakness (generalized): Secondary | ICD-10-CM | POA: Diagnosis not present

## 2016-05-31 DIAGNOSIS — R2681 Unsteadiness on feet: Secondary | ICD-10-CM | POA: Diagnosis not present

## 2016-05-31 DIAGNOSIS — Z9181 History of falling: Secondary | ICD-10-CM | POA: Diagnosis not present

## 2016-06-02 DIAGNOSIS — Z9181 History of falling: Secondary | ICD-10-CM | POA: Diagnosis not present

## 2016-06-02 DIAGNOSIS — M6281 Muscle weakness (generalized): Secondary | ICD-10-CM | POA: Diagnosis not present

## 2016-06-02 DIAGNOSIS — R2681 Unsteadiness on feet: Secondary | ICD-10-CM | POA: Diagnosis not present

## 2016-06-04 DIAGNOSIS — R2681 Unsteadiness on feet: Secondary | ICD-10-CM | POA: Diagnosis not present

## 2016-06-04 DIAGNOSIS — M6281 Muscle weakness (generalized): Secondary | ICD-10-CM | POA: Diagnosis not present

## 2016-06-04 DIAGNOSIS — Z9181 History of falling: Secondary | ICD-10-CM | POA: Diagnosis not present

## 2016-06-07 DIAGNOSIS — Z9181 History of falling: Secondary | ICD-10-CM | POA: Diagnosis not present

## 2016-06-07 DIAGNOSIS — M6281 Muscle weakness (generalized): Secondary | ICD-10-CM | POA: Diagnosis not present

## 2016-06-07 DIAGNOSIS — R2681 Unsteadiness on feet: Secondary | ICD-10-CM | POA: Diagnosis not present

## 2016-06-09 DIAGNOSIS — R062 Wheezing: Secondary | ICD-10-CM | POA: Diagnosis not present

## 2016-06-10 DIAGNOSIS — R2681 Unsteadiness on feet: Secondary | ICD-10-CM | POA: Diagnosis not present

## 2016-06-10 DIAGNOSIS — M6281 Muscle weakness (generalized): Secondary | ICD-10-CM | POA: Diagnosis not present

## 2016-06-10 DIAGNOSIS — Z9181 History of falling: Secondary | ICD-10-CM | POA: Diagnosis not present

## 2016-06-11 DIAGNOSIS — I10 Essential (primary) hypertension: Secondary | ICD-10-CM | POA: Diagnosis not present

## 2016-06-14 DIAGNOSIS — R2681 Unsteadiness on feet: Secondary | ICD-10-CM | POA: Diagnosis not present

## 2016-06-14 DIAGNOSIS — M6281 Muscle weakness (generalized): Secondary | ICD-10-CM | POA: Diagnosis not present

## 2016-06-16 DIAGNOSIS — M6281 Muscle weakness (generalized): Secondary | ICD-10-CM | POA: Diagnosis not present

## 2016-06-16 DIAGNOSIS — R2681 Unsteadiness on feet: Secondary | ICD-10-CM | POA: Diagnosis not present

## 2016-06-17 DIAGNOSIS — M7989 Other specified soft tissue disorders: Secondary | ICD-10-CM | POA: Diagnosis not present

## 2016-06-17 DIAGNOSIS — G894 Chronic pain syndrome: Secondary | ICD-10-CM | POA: Diagnosis not present

## 2016-06-17 DIAGNOSIS — J449 Chronic obstructive pulmonary disease, unspecified: Secondary | ICD-10-CM | POA: Diagnosis not present

## 2016-06-17 DIAGNOSIS — J9611 Chronic respiratory failure with hypoxia: Secondary | ICD-10-CM | POA: Diagnosis not present

## 2016-06-18 DIAGNOSIS — R2681 Unsteadiness on feet: Secondary | ICD-10-CM | POA: Diagnosis not present

## 2016-06-18 DIAGNOSIS — M6281 Muscle weakness (generalized): Secondary | ICD-10-CM | POA: Diagnosis not present

## 2016-06-21 DIAGNOSIS — M6281 Muscle weakness (generalized): Secondary | ICD-10-CM | POA: Diagnosis not present

## 2016-06-21 DIAGNOSIS — R2681 Unsteadiness on feet: Secondary | ICD-10-CM | POA: Diagnosis not present

## 2016-06-23 DIAGNOSIS — R2681 Unsteadiness on feet: Secondary | ICD-10-CM | POA: Diagnosis not present

## 2016-06-23 DIAGNOSIS — M6281 Muscle weakness (generalized): Secondary | ICD-10-CM | POA: Diagnosis not present

## 2016-06-24 DIAGNOSIS — R2681 Unsteadiness on feet: Secondary | ICD-10-CM | POA: Diagnosis not present

## 2016-06-24 DIAGNOSIS — M6281 Muscle weakness (generalized): Secondary | ICD-10-CM | POA: Diagnosis not present

## 2016-06-28 DIAGNOSIS — M6281 Muscle weakness (generalized): Secondary | ICD-10-CM | POA: Diagnosis not present

## 2016-06-28 DIAGNOSIS — R2681 Unsteadiness on feet: Secondary | ICD-10-CM | POA: Diagnosis not present

## 2016-06-30 DIAGNOSIS — M6281 Muscle weakness (generalized): Secondary | ICD-10-CM | POA: Diagnosis not present

## 2016-06-30 DIAGNOSIS — R2681 Unsteadiness on feet: Secondary | ICD-10-CM | POA: Diagnosis not present

## 2016-07-01 DIAGNOSIS — L84 Corns and callosities: Secondary | ICD-10-CM | POA: Diagnosis not present

## 2016-07-01 DIAGNOSIS — B351 Tinea unguium: Secondary | ICD-10-CM | POA: Diagnosis not present

## 2016-07-02 DIAGNOSIS — M6281 Muscle weakness (generalized): Secondary | ICD-10-CM | POA: Diagnosis not present

## 2016-07-02 DIAGNOSIS — R2681 Unsteadiness on feet: Secondary | ICD-10-CM | POA: Diagnosis not present

## 2016-07-08 DIAGNOSIS — I10 Essential (primary) hypertension: Secondary | ICD-10-CM | POA: Diagnosis not present

## 2016-07-27 ENCOUNTER — Other Ambulatory Visit: Payer: Self-pay | Admitting: Internal Medicine

## 2016-08-13 DIAGNOSIS — G894 Chronic pain syndrome: Secondary | ICD-10-CM | POA: Diagnosis not present

## 2016-08-13 DIAGNOSIS — M7989 Other specified soft tissue disorders: Secondary | ICD-10-CM | POA: Diagnosis not present

## 2016-08-13 DIAGNOSIS — I10 Essential (primary) hypertension: Secondary | ICD-10-CM | POA: Diagnosis not present

## 2016-08-13 DIAGNOSIS — J9611 Chronic respiratory failure with hypoxia: Secondary | ICD-10-CM | POA: Diagnosis not present

## 2016-08-25 DIAGNOSIS — F331 Major depressive disorder, recurrent, moderate: Secondary | ICD-10-CM | POA: Diagnosis not present

## 2016-08-25 DIAGNOSIS — F411 Generalized anxiety disorder: Secondary | ICD-10-CM | POA: Diagnosis not present

## 2016-09-24 DIAGNOSIS — J9611 Chronic respiratory failure with hypoxia: Secondary | ICD-10-CM | POA: Diagnosis not present

## 2016-09-24 DIAGNOSIS — G894 Chronic pain syndrome: Secondary | ICD-10-CM | POA: Diagnosis not present

## 2016-09-24 DIAGNOSIS — M1990 Unspecified osteoarthritis, unspecified site: Secondary | ICD-10-CM | POA: Diagnosis not present

## 2016-09-24 DIAGNOSIS — I10 Essential (primary) hypertension: Secondary | ICD-10-CM | POA: Diagnosis not present

## 2016-09-28 DIAGNOSIS — F331 Major depressive disorder, recurrent, moderate: Secondary | ICD-10-CM | POA: Diagnosis not present

## 2016-09-28 DIAGNOSIS — F411 Generalized anxiety disorder: Secondary | ICD-10-CM | POA: Diagnosis not present

## 2016-10-12 DIAGNOSIS — I10 Essential (primary) hypertension: Secondary | ICD-10-CM | POA: Diagnosis not present

## 2016-10-12 DIAGNOSIS — M1990 Unspecified osteoarthritis, unspecified site: Secondary | ICD-10-CM | POA: Diagnosis not present

## 2016-10-27 DIAGNOSIS — F411 Generalized anxiety disorder: Secondary | ICD-10-CM | POA: Diagnosis not present

## 2016-10-27 DIAGNOSIS — F331 Major depressive disorder, recurrent, moderate: Secondary | ICD-10-CM | POA: Diagnosis not present

## 2016-11-04 DIAGNOSIS — M7989 Other specified soft tissue disorders: Secondary | ICD-10-CM | POA: Diagnosis not present

## 2016-11-04 DIAGNOSIS — I10 Essential (primary) hypertension: Secondary | ICD-10-CM | POA: Diagnosis not present

## 2016-11-04 DIAGNOSIS — J9611 Chronic respiratory failure with hypoxia: Secondary | ICD-10-CM | POA: Diagnosis not present

## 2016-11-04 DIAGNOSIS — M1990 Unspecified osteoarthritis, unspecified site: Secondary | ICD-10-CM | POA: Diagnosis not present

## 2016-11-11 DIAGNOSIS — I10 Essential (primary) hypertension: Secondary | ICD-10-CM | POA: Diagnosis not present

## 2016-11-11 DIAGNOSIS — M1990 Unspecified osteoarthritis, unspecified site: Secondary | ICD-10-CM | POA: Diagnosis not present

## 2016-12-01 DIAGNOSIS — F331 Major depressive disorder, recurrent, moderate: Secondary | ICD-10-CM | POA: Diagnosis not present

## 2016-12-01 DIAGNOSIS — F411 Generalized anxiety disorder: Secondary | ICD-10-CM | POA: Diagnosis not present

## 2016-12-02 DIAGNOSIS — M7989 Other specified soft tissue disorders: Secondary | ICD-10-CM | POA: Diagnosis not present

## 2016-12-02 DIAGNOSIS — I10 Essential (primary) hypertension: Secondary | ICD-10-CM | POA: Diagnosis not present

## 2016-12-15 DIAGNOSIS — I10 Essential (primary) hypertension: Secondary | ICD-10-CM | POA: Diagnosis not present

## 2016-12-15 DIAGNOSIS — M7989 Other specified soft tissue disorders: Secondary | ICD-10-CM | POA: Diagnosis not present

## 2016-12-15 DIAGNOSIS — B372 Candidiasis of skin and nail: Secondary | ICD-10-CM | POA: Diagnosis not present

## 2016-12-15 DIAGNOSIS — G894 Chronic pain syndrome: Secondary | ICD-10-CM | POA: Diagnosis not present

## 2016-12-27 DIAGNOSIS — I10 Essential (primary) hypertension: Secondary | ICD-10-CM | POA: Diagnosis not present

## 2016-12-27 DIAGNOSIS — M7989 Other specified soft tissue disorders: Secondary | ICD-10-CM | POA: Diagnosis not present

## 2016-12-31 DIAGNOSIS — R32 Unspecified urinary incontinence: Secondary | ICD-10-CM | POA: Diagnosis not present

## 2016-12-31 DIAGNOSIS — G894 Chronic pain syndrome: Secondary | ICD-10-CM | POA: Diagnosis not present

## 2016-12-31 DIAGNOSIS — J9611 Chronic respiratory failure with hypoxia: Secondary | ICD-10-CM | POA: Diagnosis not present

## 2016-12-31 DIAGNOSIS — F0391 Unspecified dementia with behavioral disturbance: Secondary | ICD-10-CM | POA: Diagnosis not present

## 2016-12-31 DIAGNOSIS — M4800 Spinal stenosis, site unspecified: Secondary | ICD-10-CM | POA: Diagnosis not present

## 2016-12-31 DIAGNOSIS — B372 Candidiasis of skin and nail: Secondary | ICD-10-CM | POA: Diagnosis not present

## 2017-01-03 DIAGNOSIS — F331 Major depressive disorder, recurrent, moderate: Secondary | ICD-10-CM | POA: Diagnosis not present

## 2017-01-03 DIAGNOSIS — F411 Generalized anxiety disorder: Secondary | ICD-10-CM | POA: Diagnosis not present

## 2017-01-07 DIAGNOSIS — B372 Candidiasis of skin and nail: Secondary | ICD-10-CM | POA: Diagnosis not present

## 2017-01-07 DIAGNOSIS — M4800 Spinal stenosis, site unspecified: Secondary | ICD-10-CM | POA: Diagnosis not present

## 2017-01-07 DIAGNOSIS — G894 Chronic pain syndrome: Secondary | ICD-10-CM | POA: Diagnosis not present

## 2017-01-07 DIAGNOSIS — F0391 Unspecified dementia with behavioral disturbance: Secondary | ICD-10-CM | POA: Diagnosis not present

## 2017-01-07 DIAGNOSIS — J9611 Chronic respiratory failure with hypoxia: Secondary | ICD-10-CM | POA: Diagnosis not present

## 2017-01-07 DIAGNOSIS — R32 Unspecified urinary incontinence: Secondary | ICD-10-CM | POA: Diagnosis not present

## 2017-01-09 DIAGNOSIS — J9611 Chronic respiratory failure with hypoxia: Secondary | ICD-10-CM | POA: Diagnosis not present

## 2017-01-09 DIAGNOSIS — G894 Chronic pain syndrome: Secondary | ICD-10-CM | POA: Diagnosis not present

## 2017-01-09 DIAGNOSIS — M4800 Spinal stenosis, site unspecified: Secondary | ICD-10-CM | POA: Diagnosis not present

## 2017-01-09 DIAGNOSIS — R32 Unspecified urinary incontinence: Secondary | ICD-10-CM | POA: Diagnosis not present

## 2017-01-09 DIAGNOSIS — F0391 Unspecified dementia with behavioral disturbance: Secondary | ICD-10-CM | POA: Diagnosis not present

## 2017-01-09 DIAGNOSIS — B372 Candidiasis of skin and nail: Secondary | ICD-10-CM | POA: Diagnosis not present

## 2017-01-10 DIAGNOSIS — F0391 Unspecified dementia with behavioral disturbance: Secondary | ICD-10-CM | POA: Diagnosis not present

## 2017-01-10 DIAGNOSIS — G894 Chronic pain syndrome: Secondary | ICD-10-CM | POA: Diagnosis not present

## 2017-01-10 DIAGNOSIS — R32 Unspecified urinary incontinence: Secondary | ICD-10-CM | POA: Diagnosis not present

## 2017-01-10 DIAGNOSIS — J9611 Chronic respiratory failure with hypoxia: Secondary | ICD-10-CM | POA: Diagnosis not present

## 2017-01-10 DIAGNOSIS — B372 Candidiasis of skin and nail: Secondary | ICD-10-CM | POA: Diagnosis not present

## 2017-01-10 DIAGNOSIS — M4800 Spinal stenosis, site unspecified: Secondary | ICD-10-CM | POA: Diagnosis not present

## 2017-01-12 DIAGNOSIS — J9611 Chronic respiratory failure with hypoxia: Secondary | ICD-10-CM | POA: Diagnosis not present

## 2017-01-12 DIAGNOSIS — G894 Chronic pain syndrome: Secondary | ICD-10-CM | POA: Diagnosis not present

## 2017-01-12 DIAGNOSIS — F0391 Unspecified dementia with behavioral disturbance: Secondary | ICD-10-CM | POA: Diagnosis not present

## 2017-01-12 DIAGNOSIS — B372 Candidiasis of skin and nail: Secondary | ICD-10-CM | POA: Diagnosis not present

## 2017-01-12 DIAGNOSIS — R32 Unspecified urinary incontinence: Secondary | ICD-10-CM | POA: Diagnosis not present

## 2017-01-12 DIAGNOSIS — M4800 Spinal stenosis, site unspecified: Secondary | ICD-10-CM | POA: Diagnosis not present

## 2017-01-13 DIAGNOSIS — J9611 Chronic respiratory failure with hypoxia: Secondary | ICD-10-CM | POA: Diagnosis not present

## 2017-01-13 DIAGNOSIS — I10 Essential (primary) hypertension: Secondary | ICD-10-CM | POA: Diagnosis not present

## 2017-01-13 DIAGNOSIS — G894 Chronic pain syndrome: Secondary | ICD-10-CM | POA: Diagnosis not present

## 2017-01-13 DIAGNOSIS — R600 Localized edema: Secondary | ICD-10-CM | POA: Diagnosis not present

## 2017-01-13 DIAGNOSIS — F419 Anxiety disorder, unspecified: Secondary | ICD-10-CM | POA: Diagnosis not present

## 2017-01-13 DIAGNOSIS — Z0000 Encounter for general adult medical examination without abnormal findings: Secondary | ICD-10-CM | POA: Diagnosis not present

## 2017-01-13 DIAGNOSIS — B372 Candidiasis of skin and nail: Secondary | ICD-10-CM | POA: Diagnosis not present

## 2017-01-13 DIAGNOSIS — N898 Other specified noninflammatory disorders of vagina: Secondary | ICD-10-CM | POA: Diagnosis not present

## 2017-01-17 DIAGNOSIS — B372 Candidiasis of skin and nail: Secondary | ICD-10-CM | POA: Diagnosis not present

## 2017-01-17 DIAGNOSIS — R32 Unspecified urinary incontinence: Secondary | ICD-10-CM | POA: Diagnosis not present

## 2017-01-17 DIAGNOSIS — M4800 Spinal stenosis, site unspecified: Secondary | ICD-10-CM | POA: Diagnosis not present

## 2017-01-17 DIAGNOSIS — J9611 Chronic respiratory failure with hypoxia: Secondary | ICD-10-CM | POA: Diagnosis not present

## 2017-01-17 DIAGNOSIS — F411 Generalized anxiety disorder: Secondary | ICD-10-CM | POA: Diagnosis not present

## 2017-01-17 DIAGNOSIS — F0391 Unspecified dementia with behavioral disturbance: Secondary | ICD-10-CM | POA: Diagnosis not present

## 2017-01-17 DIAGNOSIS — G894 Chronic pain syndrome: Secondary | ICD-10-CM | POA: Diagnosis not present

## 2017-01-17 DIAGNOSIS — F331 Major depressive disorder, recurrent, moderate: Secondary | ICD-10-CM | POA: Diagnosis not present

## 2017-01-20 DIAGNOSIS — F0391 Unspecified dementia with behavioral disturbance: Secondary | ICD-10-CM | POA: Diagnosis not present

## 2017-01-20 DIAGNOSIS — J9611 Chronic respiratory failure with hypoxia: Secondary | ICD-10-CM | POA: Diagnosis not present

## 2017-01-20 DIAGNOSIS — R32 Unspecified urinary incontinence: Secondary | ICD-10-CM | POA: Diagnosis not present

## 2017-01-20 DIAGNOSIS — M4800 Spinal stenosis, site unspecified: Secondary | ICD-10-CM | POA: Diagnosis not present

## 2017-01-20 DIAGNOSIS — B372 Candidiasis of skin and nail: Secondary | ICD-10-CM | POA: Diagnosis not present

## 2017-01-20 DIAGNOSIS — G894 Chronic pain syndrome: Secondary | ICD-10-CM | POA: Diagnosis not present

## 2017-01-24 DIAGNOSIS — B372 Candidiasis of skin and nail: Secondary | ICD-10-CM | POA: Diagnosis not present

## 2017-01-24 DIAGNOSIS — M4800 Spinal stenosis, site unspecified: Secondary | ICD-10-CM | POA: Diagnosis not present

## 2017-01-24 DIAGNOSIS — F0391 Unspecified dementia with behavioral disturbance: Secondary | ICD-10-CM | POA: Diagnosis not present

## 2017-01-24 DIAGNOSIS — J9611 Chronic respiratory failure with hypoxia: Secondary | ICD-10-CM | POA: Diagnosis not present

## 2017-01-24 DIAGNOSIS — R32 Unspecified urinary incontinence: Secondary | ICD-10-CM | POA: Diagnosis not present

## 2017-01-24 DIAGNOSIS — G894 Chronic pain syndrome: Secondary | ICD-10-CM | POA: Diagnosis not present

## 2017-01-26 DIAGNOSIS — B372 Candidiasis of skin and nail: Secondary | ICD-10-CM | POA: Diagnosis not present

## 2017-01-26 DIAGNOSIS — M4800 Spinal stenosis, site unspecified: Secondary | ICD-10-CM | POA: Diagnosis not present

## 2017-01-26 DIAGNOSIS — G894 Chronic pain syndrome: Secondary | ICD-10-CM | POA: Diagnosis not present

## 2017-01-26 DIAGNOSIS — J9611 Chronic respiratory failure with hypoxia: Secondary | ICD-10-CM | POA: Diagnosis not present

## 2017-01-26 DIAGNOSIS — R32 Unspecified urinary incontinence: Secondary | ICD-10-CM | POA: Diagnosis not present

## 2017-01-26 DIAGNOSIS — F0391 Unspecified dementia with behavioral disturbance: Secondary | ICD-10-CM | POA: Diagnosis not present

## 2017-01-28 DIAGNOSIS — F411 Generalized anxiety disorder: Secondary | ICD-10-CM | POA: Diagnosis not present

## 2017-01-28 DIAGNOSIS — F0391 Unspecified dementia with behavioral disturbance: Secondary | ICD-10-CM | POA: Diagnosis not present

## 2017-01-28 DIAGNOSIS — F331 Major depressive disorder, recurrent, moderate: Secondary | ICD-10-CM | POA: Diagnosis not present

## 2017-01-28 DIAGNOSIS — G894 Chronic pain syndrome: Secondary | ICD-10-CM | POA: Diagnosis not present

## 2017-01-28 DIAGNOSIS — R32 Unspecified urinary incontinence: Secondary | ICD-10-CM | POA: Diagnosis not present

## 2017-01-28 DIAGNOSIS — J9611 Chronic respiratory failure with hypoxia: Secondary | ICD-10-CM | POA: Diagnosis not present

## 2017-01-28 DIAGNOSIS — M4800 Spinal stenosis, site unspecified: Secondary | ICD-10-CM | POA: Diagnosis not present

## 2017-01-28 DIAGNOSIS — B372 Candidiasis of skin and nail: Secondary | ICD-10-CM | POA: Diagnosis not present

## 2017-01-30 DIAGNOSIS — G894 Chronic pain syndrome: Secondary | ICD-10-CM | POA: Diagnosis not present

## 2017-01-30 DIAGNOSIS — F0391 Unspecified dementia with behavioral disturbance: Secondary | ICD-10-CM | POA: Diagnosis not present

## 2017-01-30 DIAGNOSIS — B372 Candidiasis of skin and nail: Secondary | ICD-10-CM | POA: Diagnosis not present

## 2017-01-30 DIAGNOSIS — R32 Unspecified urinary incontinence: Secondary | ICD-10-CM | POA: Diagnosis not present

## 2017-01-30 DIAGNOSIS — M4800 Spinal stenosis, site unspecified: Secondary | ICD-10-CM | POA: Diagnosis not present

## 2017-01-30 DIAGNOSIS — J9611 Chronic respiratory failure with hypoxia: Secondary | ICD-10-CM | POA: Diagnosis not present

## 2017-02-01 DIAGNOSIS — M4800 Spinal stenosis, site unspecified: Secondary | ICD-10-CM | POA: Diagnosis not present

## 2017-02-01 DIAGNOSIS — R32 Unspecified urinary incontinence: Secondary | ICD-10-CM | POA: Diagnosis not present

## 2017-02-01 DIAGNOSIS — J9611 Chronic respiratory failure with hypoxia: Secondary | ICD-10-CM | POA: Diagnosis not present

## 2017-02-01 DIAGNOSIS — B372 Candidiasis of skin and nail: Secondary | ICD-10-CM | POA: Diagnosis not present

## 2017-02-01 DIAGNOSIS — I10 Essential (primary) hypertension: Secondary | ICD-10-CM | POA: Diagnosis not present

## 2017-02-01 DIAGNOSIS — G894 Chronic pain syndrome: Secondary | ICD-10-CM | POA: Diagnosis not present

## 2017-02-01 DIAGNOSIS — F0391 Unspecified dementia with behavioral disturbance: Secondary | ICD-10-CM | POA: Diagnosis not present

## 2017-02-03 DIAGNOSIS — F0391 Unspecified dementia with behavioral disturbance: Secondary | ICD-10-CM | POA: Diagnosis not present

## 2017-02-03 DIAGNOSIS — G894 Chronic pain syndrome: Secondary | ICD-10-CM | POA: Diagnosis not present

## 2017-02-03 DIAGNOSIS — B372 Candidiasis of skin and nail: Secondary | ICD-10-CM | POA: Diagnosis not present

## 2017-02-03 DIAGNOSIS — J9611 Chronic respiratory failure with hypoxia: Secondary | ICD-10-CM | POA: Diagnosis not present

## 2017-02-03 DIAGNOSIS — R32 Unspecified urinary incontinence: Secondary | ICD-10-CM | POA: Diagnosis not present

## 2017-02-03 DIAGNOSIS — M4800 Spinal stenosis, site unspecified: Secondary | ICD-10-CM | POA: Diagnosis not present

## 2017-02-07 DIAGNOSIS — F0391 Unspecified dementia with behavioral disturbance: Secondary | ICD-10-CM | POA: Diagnosis not present

## 2017-02-07 DIAGNOSIS — R32 Unspecified urinary incontinence: Secondary | ICD-10-CM | POA: Diagnosis not present

## 2017-02-07 DIAGNOSIS — G894 Chronic pain syndrome: Secondary | ICD-10-CM | POA: Diagnosis not present

## 2017-02-07 DIAGNOSIS — J9611 Chronic respiratory failure with hypoxia: Secondary | ICD-10-CM | POA: Diagnosis not present

## 2017-02-07 DIAGNOSIS — B372 Candidiasis of skin and nail: Secondary | ICD-10-CM | POA: Diagnosis not present

## 2017-02-07 DIAGNOSIS — M4800 Spinal stenosis, site unspecified: Secondary | ICD-10-CM | POA: Diagnosis not present

## 2017-02-09 DIAGNOSIS — B372 Candidiasis of skin and nail: Secondary | ICD-10-CM | POA: Diagnosis not present

## 2017-02-09 DIAGNOSIS — R32 Unspecified urinary incontinence: Secondary | ICD-10-CM | POA: Diagnosis not present

## 2017-02-09 DIAGNOSIS — M4800 Spinal stenosis, site unspecified: Secondary | ICD-10-CM | POA: Diagnosis not present

## 2017-02-09 DIAGNOSIS — F0391 Unspecified dementia with behavioral disturbance: Secondary | ICD-10-CM | POA: Diagnosis not present

## 2017-02-09 DIAGNOSIS — J9611 Chronic respiratory failure with hypoxia: Secondary | ICD-10-CM | POA: Diagnosis not present

## 2017-02-09 DIAGNOSIS — G894 Chronic pain syndrome: Secondary | ICD-10-CM | POA: Diagnosis not present

## 2017-02-11 DIAGNOSIS — J9611 Chronic respiratory failure with hypoxia: Secondary | ICD-10-CM | POA: Diagnosis not present

## 2017-02-11 DIAGNOSIS — F0391 Unspecified dementia with behavioral disturbance: Secondary | ICD-10-CM | POA: Diagnosis not present

## 2017-02-11 DIAGNOSIS — G894 Chronic pain syndrome: Secondary | ICD-10-CM | POA: Diagnosis not present

## 2017-02-11 DIAGNOSIS — R32 Unspecified urinary incontinence: Secondary | ICD-10-CM | POA: Diagnosis not present

## 2017-02-11 DIAGNOSIS — M4800 Spinal stenosis, site unspecified: Secondary | ICD-10-CM | POA: Diagnosis not present

## 2017-02-11 DIAGNOSIS — B372 Candidiasis of skin and nail: Secondary | ICD-10-CM | POA: Diagnosis not present

## 2017-02-12 DIAGNOSIS — R32 Unspecified urinary incontinence: Secondary | ICD-10-CM | POA: Diagnosis not present

## 2017-02-12 DIAGNOSIS — J9611 Chronic respiratory failure with hypoxia: Secondary | ICD-10-CM | POA: Diagnosis not present

## 2017-02-12 DIAGNOSIS — F0391 Unspecified dementia with behavioral disturbance: Secondary | ICD-10-CM | POA: Diagnosis not present

## 2017-02-12 DIAGNOSIS — G894 Chronic pain syndrome: Secondary | ICD-10-CM | POA: Diagnosis not present

## 2017-02-12 DIAGNOSIS — B372 Candidiasis of skin and nail: Secondary | ICD-10-CM | POA: Diagnosis not present

## 2017-02-12 DIAGNOSIS — M4800 Spinal stenosis, site unspecified: Secondary | ICD-10-CM | POA: Diagnosis not present

## 2017-02-14 DIAGNOSIS — B372 Candidiasis of skin and nail: Secondary | ICD-10-CM | POA: Diagnosis not present

## 2017-02-14 DIAGNOSIS — G894 Chronic pain syndrome: Secondary | ICD-10-CM | POA: Diagnosis not present

## 2017-02-14 DIAGNOSIS — J9611 Chronic respiratory failure with hypoxia: Secondary | ICD-10-CM | POA: Diagnosis not present

## 2017-02-14 DIAGNOSIS — M4800 Spinal stenosis, site unspecified: Secondary | ICD-10-CM | POA: Diagnosis not present

## 2017-02-14 DIAGNOSIS — R32 Unspecified urinary incontinence: Secondary | ICD-10-CM | POA: Diagnosis not present

## 2017-02-14 DIAGNOSIS — F0391 Unspecified dementia with behavioral disturbance: Secondary | ICD-10-CM | POA: Diagnosis not present

## 2017-02-14 DIAGNOSIS — F411 Generalized anxiety disorder: Secondary | ICD-10-CM | POA: Diagnosis not present

## 2017-02-14 DIAGNOSIS — F331 Major depressive disorder, recurrent, moderate: Secondary | ICD-10-CM | POA: Diagnosis not present

## 2017-02-16 DIAGNOSIS — M4800 Spinal stenosis, site unspecified: Secondary | ICD-10-CM | POA: Diagnosis not present

## 2017-02-16 DIAGNOSIS — J9611 Chronic respiratory failure with hypoxia: Secondary | ICD-10-CM | POA: Diagnosis not present

## 2017-02-16 DIAGNOSIS — F0391 Unspecified dementia with behavioral disturbance: Secondary | ICD-10-CM | POA: Diagnosis not present

## 2017-02-16 DIAGNOSIS — B372 Candidiasis of skin and nail: Secondary | ICD-10-CM | POA: Diagnosis not present

## 2017-02-16 DIAGNOSIS — G894 Chronic pain syndrome: Secondary | ICD-10-CM | POA: Diagnosis not present

## 2017-02-16 DIAGNOSIS — R32 Unspecified urinary incontinence: Secondary | ICD-10-CM | POA: Diagnosis not present

## 2017-02-18 DIAGNOSIS — B372 Candidiasis of skin and nail: Secondary | ICD-10-CM | POA: Diagnosis not present

## 2017-02-18 DIAGNOSIS — M4800 Spinal stenosis, site unspecified: Secondary | ICD-10-CM | POA: Diagnosis not present

## 2017-02-18 DIAGNOSIS — R32 Unspecified urinary incontinence: Secondary | ICD-10-CM | POA: Diagnosis not present

## 2017-02-18 DIAGNOSIS — J9611 Chronic respiratory failure with hypoxia: Secondary | ICD-10-CM | POA: Diagnosis not present

## 2017-02-18 DIAGNOSIS — F0391 Unspecified dementia with behavioral disturbance: Secondary | ICD-10-CM | POA: Diagnosis not present

## 2017-02-18 DIAGNOSIS — G894 Chronic pain syndrome: Secondary | ICD-10-CM | POA: Diagnosis not present

## 2017-02-19 DIAGNOSIS — Z466 Encounter for fitting and adjustment of urinary device: Secondary | ICD-10-CM | POA: Diagnosis not present

## 2017-02-19 DIAGNOSIS — I10 Essential (primary) hypertension: Secondary | ICD-10-CM | POA: Diagnosis not present

## 2017-02-19 DIAGNOSIS — B372 Candidiasis of skin and nail: Secondary | ICD-10-CM | POA: Diagnosis not present

## 2017-02-19 DIAGNOSIS — F0391 Unspecified dementia with behavioral disturbance: Secondary | ICD-10-CM | POA: Diagnosis not present

## 2017-02-22 DIAGNOSIS — J9611 Chronic respiratory failure with hypoxia: Secondary | ICD-10-CM | POA: Diagnosis not present

## 2017-02-22 DIAGNOSIS — B372 Candidiasis of skin and nail: Secondary | ICD-10-CM | POA: Diagnosis not present

## 2017-02-22 DIAGNOSIS — M4800 Spinal stenosis, site unspecified: Secondary | ICD-10-CM | POA: Diagnosis not present

## 2017-02-22 DIAGNOSIS — F0391 Unspecified dementia with behavioral disturbance: Secondary | ICD-10-CM | POA: Diagnosis not present

## 2017-02-22 DIAGNOSIS — G894 Chronic pain syndrome: Secondary | ICD-10-CM | POA: Diagnosis not present

## 2017-02-22 DIAGNOSIS — R32 Unspecified urinary incontinence: Secondary | ICD-10-CM | POA: Diagnosis not present

## 2017-02-24 DIAGNOSIS — R269 Unspecified abnormalities of gait and mobility: Secondary | ICD-10-CM | POA: Diagnosis not present

## 2017-02-24 DIAGNOSIS — F0391 Unspecified dementia with behavioral disturbance: Secondary | ICD-10-CM | POA: Diagnosis not present

## 2017-02-24 DIAGNOSIS — I10 Essential (primary) hypertension: Secondary | ICD-10-CM | POA: Diagnosis not present

## 2017-02-24 DIAGNOSIS — M4800 Spinal stenosis, site unspecified: Secondary | ICD-10-CM | POA: Diagnosis not present

## 2017-02-24 DIAGNOSIS — B372 Candidiasis of skin and nail: Secondary | ICD-10-CM | POA: Diagnosis not present

## 2017-02-24 DIAGNOSIS — G894 Chronic pain syndrome: Secondary | ICD-10-CM | POA: Diagnosis not present

## 2017-02-24 DIAGNOSIS — J9611 Chronic respiratory failure with hypoxia: Secondary | ICD-10-CM | POA: Diagnosis not present

## 2017-02-24 DIAGNOSIS — R195 Other fecal abnormalities: Secondary | ICD-10-CM | POA: Diagnosis not present

## 2017-02-24 DIAGNOSIS — R32 Unspecified urinary incontinence: Secondary | ICD-10-CM | POA: Diagnosis not present

## 2017-02-26 DIAGNOSIS — R32 Unspecified urinary incontinence: Secondary | ICD-10-CM | POA: Diagnosis not present

## 2017-02-26 DIAGNOSIS — G894 Chronic pain syndrome: Secondary | ICD-10-CM | POA: Diagnosis not present

## 2017-02-26 DIAGNOSIS — F0391 Unspecified dementia with behavioral disturbance: Secondary | ICD-10-CM | POA: Diagnosis not present

## 2017-02-26 DIAGNOSIS — M4800 Spinal stenosis, site unspecified: Secondary | ICD-10-CM | POA: Diagnosis not present

## 2017-02-26 DIAGNOSIS — J9611 Chronic respiratory failure with hypoxia: Secondary | ICD-10-CM | POA: Diagnosis not present

## 2017-02-26 DIAGNOSIS — B372 Candidiasis of skin and nail: Secondary | ICD-10-CM | POA: Diagnosis not present

## 2017-02-27 DIAGNOSIS — B372 Candidiasis of skin and nail: Secondary | ICD-10-CM | POA: Diagnosis not present

## 2017-02-27 DIAGNOSIS — M4800 Spinal stenosis, site unspecified: Secondary | ICD-10-CM | POA: Diagnosis not present

## 2017-02-27 DIAGNOSIS — F0391 Unspecified dementia with behavioral disturbance: Secondary | ICD-10-CM | POA: Diagnosis not present

## 2017-02-27 DIAGNOSIS — R32 Unspecified urinary incontinence: Secondary | ICD-10-CM | POA: Diagnosis not present

## 2017-02-27 DIAGNOSIS — J9611 Chronic respiratory failure with hypoxia: Secondary | ICD-10-CM | POA: Diagnosis not present

## 2017-02-27 DIAGNOSIS — G894 Chronic pain syndrome: Secondary | ICD-10-CM | POA: Diagnosis not present

## 2017-03-01 DIAGNOSIS — I10 Essential (primary) hypertension: Secondary | ICD-10-CM | POA: Diagnosis not present

## 2017-03-01 DIAGNOSIS — B372 Candidiasis of skin and nail: Secondary | ICD-10-CM | POA: Diagnosis not present

## 2017-03-01 DIAGNOSIS — Z466 Encounter for fitting and adjustment of urinary device: Secondary | ICD-10-CM | POA: Diagnosis not present

## 2017-03-01 DIAGNOSIS — J9611 Chronic respiratory failure with hypoxia: Secondary | ICD-10-CM | POA: Diagnosis not present

## 2017-03-01 DIAGNOSIS — R32 Unspecified urinary incontinence: Secondary | ICD-10-CM | POA: Diagnosis not present

## 2017-03-01 DIAGNOSIS — F0391 Unspecified dementia with behavioral disturbance: Secondary | ICD-10-CM | POA: Diagnosis not present

## 2017-03-02 DIAGNOSIS — J9611 Chronic respiratory failure with hypoxia: Secondary | ICD-10-CM | POA: Diagnosis not present

## 2017-03-02 DIAGNOSIS — R32 Unspecified urinary incontinence: Secondary | ICD-10-CM | POA: Diagnosis not present

## 2017-03-02 DIAGNOSIS — B372 Candidiasis of skin and nail: Secondary | ICD-10-CM | POA: Diagnosis not present

## 2017-03-02 DIAGNOSIS — F0391 Unspecified dementia with behavioral disturbance: Secondary | ICD-10-CM | POA: Diagnosis not present

## 2017-03-02 DIAGNOSIS — I10 Essential (primary) hypertension: Secondary | ICD-10-CM | POA: Diagnosis not present

## 2017-03-02 DIAGNOSIS — Z466 Encounter for fitting and adjustment of urinary device: Secondary | ICD-10-CM | POA: Diagnosis not present

## 2017-03-03 DIAGNOSIS — I10 Essential (primary) hypertension: Secondary | ICD-10-CM | POA: Diagnosis not present

## 2017-03-06 DIAGNOSIS — J9611 Chronic respiratory failure with hypoxia: Secondary | ICD-10-CM | POA: Diagnosis not present

## 2017-03-06 DIAGNOSIS — R32 Unspecified urinary incontinence: Secondary | ICD-10-CM | POA: Diagnosis not present

## 2017-03-06 DIAGNOSIS — I10 Essential (primary) hypertension: Secondary | ICD-10-CM | POA: Diagnosis not present

## 2017-03-06 DIAGNOSIS — F0391 Unspecified dementia with behavioral disturbance: Secondary | ICD-10-CM | POA: Diagnosis not present

## 2017-03-06 DIAGNOSIS — Z466 Encounter for fitting and adjustment of urinary device: Secondary | ICD-10-CM | POA: Diagnosis not present

## 2017-03-06 DIAGNOSIS — B372 Candidiasis of skin and nail: Secondary | ICD-10-CM | POA: Diagnosis not present

## 2017-03-07 DIAGNOSIS — R32 Unspecified urinary incontinence: Secondary | ICD-10-CM | POA: Diagnosis not present

## 2017-03-07 DIAGNOSIS — J9611 Chronic respiratory failure with hypoxia: Secondary | ICD-10-CM | POA: Diagnosis not present

## 2017-03-07 DIAGNOSIS — I10 Essential (primary) hypertension: Secondary | ICD-10-CM | POA: Diagnosis not present

## 2017-03-07 DIAGNOSIS — Z466 Encounter for fitting and adjustment of urinary device: Secondary | ICD-10-CM | POA: Diagnosis not present

## 2017-03-07 DIAGNOSIS — F0391 Unspecified dementia with behavioral disturbance: Secondary | ICD-10-CM | POA: Diagnosis not present

## 2017-03-07 DIAGNOSIS — B372 Candidiasis of skin and nail: Secondary | ICD-10-CM | POA: Diagnosis not present

## 2017-03-13 DIAGNOSIS — R32 Unspecified urinary incontinence: Secondary | ICD-10-CM | POA: Diagnosis not present

## 2017-03-13 DIAGNOSIS — F0391 Unspecified dementia with behavioral disturbance: Secondary | ICD-10-CM | POA: Diagnosis not present

## 2017-03-13 DIAGNOSIS — J9611 Chronic respiratory failure with hypoxia: Secondary | ICD-10-CM | POA: Diagnosis not present

## 2017-03-13 DIAGNOSIS — I10 Essential (primary) hypertension: Secondary | ICD-10-CM | POA: Diagnosis not present

## 2017-03-13 DIAGNOSIS — Z466 Encounter for fitting and adjustment of urinary device: Secondary | ICD-10-CM | POA: Diagnosis not present

## 2017-03-13 DIAGNOSIS — B372 Candidiasis of skin and nail: Secondary | ICD-10-CM | POA: Diagnosis not present

## 2017-03-17 DIAGNOSIS — F331 Major depressive disorder, recurrent, moderate: Secondary | ICD-10-CM | POA: Diagnosis not present

## 2017-03-17 DIAGNOSIS — F411 Generalized anxiety disorder: Secondary | ICD-10-CM | POA: Diagnosis not present

## 2017-03-25 ENCOUNTER — Other Ambulatory Visit: Payer: Self-pay | Admitting: Internal Medicine

## 2017-03-25 DIAGNOSIS — Z1321 Encounter for screening for nutritional disorder: Secondary | ICD-10-CM

## 2017-03-25 DIAGNOSIS — I10 Essential (primary) hypertension: Secondary | ICD-10-CM

## 2017-03-25 DIAGNOSIS — E039 Hypothyroidism, unspecified: Secondary | ICD-10-CM

## 2017-03-25 DIAGNOSIS — E785 Hyperlipidemia, unspecified: Secondary | ICD-10-CM

## 2017-03-25 DIAGNOSIS — Z0000 Encounter for general adult medical examination without abnormal findings: Secondary | ICD-10-CM

## 2017-03-25 DIAGNOSIS — R7302 Impaired glucose tolerance (oral): Secondary | ICD-10-CM

## 2017-03-28 ENCOUNTER — Other Ambulatory Visit: Payer: Self-pay | Admitting: Internal Medicine

## 2017-03-31 DIAGNOSIS — F411 Generalized anxiety disorder: Secondary | ICD-10-CM | POA: Diagnosis not present

## 2017-03-31 DIAGNOSIS — F331 Major depressive disorder, recurrent, moderate: Secondary | ICD-10-CM | POA: Diagnosis not present

## 2017-04-04 DIAGNOSIS — R269 Unspecified abnormalities of gait and mobility: Secondary | ICD-10-CM | POA: Diagnosis not present

## 2017-04-04 DIAGNOSIS — B372 Candidiasis of skin and nail: Secondary | ICD-10-CM | POA: Diagnosis not present

## 2017-04-04 DIAGNOSIS — J9611 Chronic respiratory failure with hypoxia: Secondary | ICD-10-CM | POA: Diagnosis not present

## 2017-04-04 DIAGNOSIS — K6289 Other specified diseases of anus and rectum: Secondary | ICD-10-CM | POA: Diagnosis not present

## 2017-04-04 DIAGNOSIS — F419 Anxiety disorder, unspecified: Secondary | ICD-10-CM | POA: Diagnosis not present

## 2017-04-18 ENCOUNTER — Other Ambulatory Visit: Payer: PPO | Admitting: Internal Medicine

## 2017-04-18 DIAGNOSIS — H524 Presbyopia: Secondary | ICD-10-CM | POA: Diagnosis not present

## 2017-04-18 DIAGNOSIS — Z1321 Encounter for screening for nutritional disorder: Secondary | ICD-10-CM

## 2017-04-18 DIAGNOSIS — E785 Hyperlipidemia, unspecified: Secondary | ICD-10-CM | POA: Diagnosis not present

## 2017-04-18 DIAGNOSIS — E039 Hypothyroidism, unspecified: Secondary | ICD-10-CM

## 2017-04-18 DIAGNOSIS — R7302 Impaired glucose tolerance (oral): Secondary | ICD-10-CM

## 2017-04-18 DIAGNOSIS — I10 Essential (primary) hypertension: Secondary | ICD-10-CM

## 2017-04-18 DIAGNOSIS — Z0000 Encounter for general adult medical examination without abnormal findings: Secondary | ICD-10-CM

## 2017-04-19 LAB — CBC WITH DIFFERENTIAL/PLATELET
BASOS ABS: 81 {cells}/uL (ref 0–200)
Basophils Relative: 1.1 %
EOS PCT: 3.8 %
Eosinophils Absolute: 281 cells/uL (ref 15–500)
HCT: 32.3 % — ABNORMAL LOW (ref 35.0–45.0)
Hemoglobin: 10.9 g/dL — ABNORMAL LOW (ref 11.7–15.5)
Lymphs Abs: 1576 cells/uL (ref 850–3900)
MCH: 28.5 pg (ref 27.0–33.0)
MCHC: 33.7 g/dL (ref 32.0–36.0)
MCV: 84.3 fL (ref 80.0–100.0)
MONOS PCT: 9.5 %
MPV: 8.8 fL (ref 7.5–12.5)
NEUTROS PCT: 64.3 %
Neutro Abs: 4758 cells/uL (ref 1500–7800)
Platelets: 247 10*3/uL (ref 140–400)
RBC: 3.83 10*6/uL (ref 3.80–5.10)
RDW: 14 % (ref 11.0–15.0)
Total Lymphocyte: 21.3 %
WBC mixed population: 703 cells/uL (ref 200–950)
WBC: 7.4 10*3/uL (ref 3.8–10.8)

## 2017-04-19 LAB — COMPLETE METABOLIC PANEL WITH GFR
AG RATIO: 1.4 (calc) (ref 1.0–2.5)
ALBUMIN MSPROF: 3.9 g/dL (ref 3.6–5.1)
ALT: 14 U/L (ref 6–29)
AST: 18 U/L (ref 10–35)
Alkaline phosphatase (APISO): 57 U/L (ref 33–130)
BILIRUBIN TOTAL: 0.3 mg/dL (ref 0.2–1.2)
BUN / CREAT RATIO: 16 (calc) (ref 6–22)
BUN: 17 mg/dL (ref 7–25)
CHLORIDE: 98 mmol/L (ref 98–110)
CO2: 30 mmol/L (ref 20–32)
Calcium: 9.2 mg/dL (ref 8.6–10.4)
Creat: 1.08 mg/dL — ABNORMAL HIGH (ref 0.60–0.88)
GFR, EST AFRICAN AMERICAN: 53 mL/min/{1.73_m2} — AB (ref >=60)
GFR, Est Non African American: 46 mL/min/{1.73_m2} — ABNORMAL LOW (ref >=60)
Globulin: 2.7 g/dL (calc) (ref 1.9–3.7)
Glucose, Bld: 89 mg/dL (ref 65–139)
POTASSIUM: 4.2 mmol/L (ref 3.5–5.3)
SODIUM: 136 mmol/L (ref 135–146)
TOTAL PROTEIN: 6.6 g/dL (ref 6.1–8.1)

## 2017-04-19 LAB — LIPID PANEL
Cholesterol: 263 mg/dL — ABNORMAL HIGH (ref <200)
HDL: 26 mg/dL — AB (ref >50)
LDL Cholesterol (Calc): 192 mg/dL (calc) — ABNORMAL HIGH
NON-HDL CHOLESTEROL (CALC): 237 mg/dL — AB (ref <130)
TRIGLYCERIDES: 244 mg/dL — AB (ref <150)
Total CHOL/HDL Ratio: 10.1 (calc) — ABNORMAL HIGH (ref <5.0)

## 2017-04-19 LAB — HEMOGLOBIN A1C
Hgb A1c MFr Bld: 5.8 % of total Hgb — ABNORMAL HIGH (ref <5.7)
Mean Plasma Glucose: 120 (calc)
eAG (mmol/L): 6.6 (calc)

## 2017-04-19 LAB — TSH: TSH: 0.82 m[IU]/L (ref 0.40–4.50)

## 2017-04-19 LAB — VITAMIN D 25 HYDROXY (VIT D DEFICIENCY, FRACTURES): VIT D 25 HYDROXY: 28 ng/mL — AB (ref 30–100)

## 2017-04-25 ENCOUNTER — Encounter: Payer: Self-pay | Admitting: Internal Medicine

## 2017-04-27 DIAGNOSIS — L304 Erythema intertrigo: Secondary | ICD-10-CM | POA: Diagnosis not present

## 2017-04-27 DIAGNOSIS — L89311 Pressure ulcer of right buttock, stage 1: Secondary | ICD-10-CM | POA: Diagnosis not present

## 2017-04-27 DIAGNOSIS — L89321 Pressure ulcer of left buttock, stage 1: Secondary | ICD-10-CM | POA: Diagnosis not present

## 2017-04-27 DIAGNOSIS — Z23 Encounter for immunization: Secondary | ICD-10-CM | POA: Diagnosis not present

## 2017-04-27 DIAGNOSIS — L309 Dermatitis, unspecified: Secondary | ICD-10-CM | POA: Diagnosis not present

## 2017-05-10 DIAGNOSIS — F331 Major depressive disorder, recurrent, moderate: Secondary | ICD-10-CM | POA: Diagnosis not present

## 2017-05-10 DIAGNOSIS — F411 Generalized anxiety disorder: Secondary | ICD-10-CM | POA: Diagnosis not present

## 2017-05-18 DIAGNOSIS — L97511 Non-pressure chronic ulcer of other part of right foot limited to breakdown of skin: Secondary | ICD-10-CM | POA: Diagnosis not present

## 2017-05-18 DIAGNOSIS — L84 Corns and callosities: Secondary | ICD-10-CM | POA: Diagnosis not present

## 2017-05-18 DIAGNOSIS — B351 Tinea unguium: Secondary | ICD-10-CM | POA: Diagnosis not present

## 2017-05-25 DIAGNOSIS — F331 Major depressive disorder, recurrent, moderate: Secondary | ICD-10-CM | POA: Diagnosis not present

## 2017-05-25 DIAGNOSIS — F411 Generalized anxiety disorder: Secondary | ICD-10-CM | POA: Diagnosis not present

## 2017-06-13 ENCOUNTER — Ambulatory Visit (INDEPENDENT_AMBULATORY_CARE_PROVIDER_SITE_OTHER): Payer: PPO | Admitting: Internal Medicine

## 2017-06-13 ENCOUNTER — Encounter: Payer: Self-pay | Admitting: Internal Medicine

## 2017-06-13 VITALS — BP 120/70 | HR 84

## 2017-06-13 DIAGNOSIS — F329 Major depressive disorder, single episode, unspecified: Secondary | ICD-10-CM

## 2017-06-13 DIAGNOSIS — R609 Edema, unspecified: Secondary | ICD-10-CM | POA: Diagnosis not present

## 2017-06-13 DIAGNOSIS — E039 Hypothyroidism, unspecified: Secondary | ICD-10-CM

## 2017-06-13 DIAGNOSIS — D649 Anemia, unspecified: Secondary | ICD-10-CM

## 2017-06-13 DIAGNOSIS — G8929 Other chronic pain: Secondary | ICD-10-CM

## 2017-06-13 DIAGNOSIS — I10 Essential (primary) hypertension: Secondary | ICD-10-CM | POA: Diagnosis not present

## 2017-06-13 DIAGNOSIS — Z0000 Encounter for general adult medical examination without abnormal findings: Secondary | ICD-10-CM

## 2017-06-13 DIAGNOSIS — Z87898 Personal history of other specified conditions: Secondary | ICD-10-CM

## 2017-06-13 DIAGNOSIS — M25559 Pain in unspecified hip: Secondary | ICD-10-CM

## 2017-06-13 DIAGNOSIS — D508 Other iron deficiency anemias: Secondary | ICD-10-CM | POA: Diagnosis not present

## 2017-06-13 DIAGNOSIS — K219 Gastro-esophageal reflux disease without esophagitis: Secondary | ICD-10-CM

## 2017-06-13 DIAGNOSIS — L304 Erythema intertrigo: Secondary | ICD-10-CM

## 2017-06-13 DIAGNOSIS — E785 Hyperlipidemia, unspecified: Secondary | ICD-10-CM

## 2017-06-13 DIAGNOSIS — R7302 Impaired glucose tolerance (oral): Secondary | ICD-10-CM | POA: Diagnosis not present

## 2017-06-13 DIAGNOSIS — F419 Anxiety disorder, unspecified: Secondary | ICD-10-CM

## 2017-06-13 DIAGNOSIS — N3946 Mixed incontinence: Secondary | ICD-10-CM | POA: Diagnosis not present

## 2017-06-13 DIAGNOSIS — L03115 Cellulitis of right lower limb: Secondary | ICD-10-CM | POA: Diagnosis not present

## 2017-06-13 DIAGNOSIS — L89300 Pressure ulcer of unspecified buttock, unstageable: Secondary | ICD-10-CM

## 2017-06-13 DIAGNOSIS — F32A Depression, unspecified: Secondary | ICD-10-CM

## 2017-06-13 DIAGNOSIS — R4181 Age-related cognitive decline: Secondary | ICD-10-CM

## 2017-06-13 MED ORDER — CEFTRIAXONE SODIUM 1 G IJ SOLR
1.0000 g | Freq: Once | INTRAMUSCULAR | Status: AC
  Administered 2017-06-13: 1 g via INTRAMUSCULAR

## 2017-06-13 NOTE — Progress Notes (Signed)
Subjective:    Patient ID: Kelly Irwin, female    DOB: Feb 09, 1928, 84 y.o.   MRN: [id number]  HPI 84 year old Female presents to the office for the first time today.  Her daughter is Dr. Anthoney Irwin.  Mrs. Kelly Irwin resides in [facility].  She has had issues with depression and skin breakdown of her buttocks.  She sees Dr. Delman Irwin, dermatologist.  Recently developed cellulitis of right lower extremity despite being on Keflex.  Has chronic hip pain treated with methadone.  She has history of mild dementia and psychosis treated with Abilify 15 mg daily.  She is on BuSpar 30 mg daily, multivitamin Lasix 20 mg daily.  She has history of hypothyroidism and is on levothyroxine 0.075 mg daily.  She takes methadone 3 times daily for pain and is on melatonin 5 mg at bedtime.  She takes Singulair 10 mg daily for wheezing and Myrbetriq 25 mg every 24 hours for bladder spasms/urinary issues.  She is on omeprazole 20 mg daily for GE reflux.  Old records indicate she has a history of pneumonia, UTI, hyponatremia, abnormal gait, hyperlipidemia, constipation, urinary incontinence, spinal stenosis, shoulder pain, esophagitis, hypothyroidism anxiety depression and hypertension.  She had a total abdominal hysterectomy BSO in her early 34s.  She has had 3 lumbar surgeries most recently in 06-20-1998.  She is intolerant of NSAIDs it causes GI upset, doxycycline causes a rash, gabapentin causes edema as does Depakote and Lamictal.  Losartan caused her mouth to swell.  She takes MiraLAX for constipation.  She had a fractured arm at age 43.  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  Has been retired for over 30 years.  She is a widow and completed 2 years of college.  Enjoys reading and TV.  Does not smoke.  She quit smoking 30 years ago.  No alcohol consumption.  Family history: Father died at age 73 pneumonia secondary to Alzheimer's disease  Mother  died at 1 of "old age ".  One sister died in 06/19/2017 of glioblastoma.  She has 2 sons and a daughter.  Youngest son is been diagnosed with squamous cell lung cancer stage IV and is undergoing chemotherapy.  He is 50 years old.  Another son age 37 is obese with history of degenerative joint disease.  Daughter age 21 in good health.      Review of Systems issues with intertrigo-have prescribed nonsteroidal     Objective:   Physical Exam  Constitutional: She appears well-developed and well-nourished.  HENT:  Head: Normocephalic and atraumatic.  Right Ear: External ear normal.  Left Ear: External ear normal.  Mouth/Throat: Oropharynx is clear and moist.  Eyes: Conjunctivae are normal. Right eye exhibits no discharge. Left eye exhibits no discharge.  Neck: No JVD present. No thyromegaly present.  Cardiovascular: Normal rate, regular rhythm and normal heart sounds.  No murmur heard. Pulmonary/Chest: Effort normal. No respiratory distress. She has no wheezes.  Abdominal: Bowel sounds are normal. She exhibits no distension. There is no tenderness.  Musculoskeletal: She exhibits no edema.  Cellulitis of the right lower extremity  Lymphadenopathy:    She has no cervical adenopathy.  Neurological: She is alert. No cranial nerve deficit.  Skin: Skin is warm and dry.  Intertrigo under her breast  Psychiatric: She has a normal mood and affect. Her behavior is normal.  Cooperative  Vitals reviewed.  Assessment & Plan:  Cellulitis of the right lower extremity-increase Keflex to 3 times daily 500 mg  Intertrigo-prescribe Nizoral  Anxiety depression-treated with BuSpar and Zoloft  Hypothyroidism  Skin breakdown on buttocks-treated by dermatologist, Dr. Delman Irwin and has improved  Hyperlipidemia  Impaired glucose tolerance  Chronic hip pain takes methadone 10 mg 3 times daily  Urinary incontinence treated with Myrbetriq  GE reflux treated with omeprazole  History of  wheezing treated with Spiriva and Singulair  History of senility/psychosis treated with Abilify   Also takes low-dose Lasix for dependent edema  Takes vitamin D supplement  Iron deficiency anemia-please make sure you are taking iron supplement  Plan: Return in 1 year or as needed.  Dr. Dillard Irwin wanted her mother to establish year but she has access [facility] medical care at [facility].  Subjective:   Patient presents for Medicare Annual/Subsequent preventive examination.  Review Past Medical/Family/Social: See above   Risk Factors  Current exercise habits: Unable to exercise Dietary issues discussed: Low-fat low-carb  Cardiac risk factors: Hyperlipidemia and impaired glucose tolerance  Depression Screen  (Note: if answer to either of the following is "Yes", a more complete depression screening is indicated)   Over the past two weeks, have you felt down, depressed or hopeless?  yes Over the past two weeks, have you felt little interest or pleasure in doing things? yes Have you lost interest or pleasure in daily life? No Do you often feel hopeless? No Do you cry easily over simple problems?  yes  Activities of Daily Living  In your present state of health, do you have any difficulty performing the following activities?:   Driving?  No longer drives Managing money?  Does not manage her money Feeding yourself? No  Getting from bed to chair?  Has issues with this Climbing a flight of stairs?  Unable to do this Preparing food and eating?:  Food is prepared for her Bathing or showering?  Needs help Getting dressed: Needs help Getting to the toilet?  Needs help Using the toilet: Needs help Moving around from place to place: Limited due to hip pain and mobility issues In the past year have you fallen or had a near fall?:  Yes Are you sexually active? No  Do you have more than one partner? No   Hearing Difficulties: No  Do you often ask people to speak up or repeat themselves?   Yes Do you experience ringing or noises in your ears? No  Do you have difficulty understanding soft or whispered voices?  Yes Do you feel that you have a problem with memory?  Yes Do you often misplace items? No    Home Safety:  Do you have a smoke alarm at your residence? Yes Do you have grab bars in the bathroom?  Yes Do you have throw rugs in your house?  No   Cognitive Testing  Alert? Yes Normal Appearance?Yes  Oriented to person? Yes Place? Yes  Time?  Not tested Recall of three objects?  Not tested Can perform simple calculations?  Not tested Displays appropriate judgment?Yes  Can read the correct time from a watch face?  Not tested  List the Names of Other Physician/Practitioners you currently use:  See referral list for the physicians patient is currently seeing.     Review of Systems: See above   Objective:     General appearance: Appears stated age, cooperative Head: Normocephalic, without obvious abnormality, atraumatic  Eyes: conj clear, EOMi PEERLA  Ears: normal TM's  and external ear canals both ears  Nose: Nares normal. Septum midline. Mucosa normal. No drainage or sinus tenderness.  Throat: lips, mucosa, and tongue normal; teeth and gums normal  Neck: no adenopathy, no carotid bruit, no JVD, supple, symmetrical, trachea midline and thyroid not enlarged, symmetric, no tenderness/mass/nodules  No CVA tenderness.  Lungs: clear to auscultation bilaterally  Breasts: normal appearance, no masses  Heart: regular rate and rhythm, S1, S2 normal, no murmur, click, rub or gallop  Abdomen: soft, non-tender; bowel sounds normal; no masses, no organomegaly   Skin: Lymph nodes: Cervical, supraclavicular, and axillary nodes normal.  Neurologic: CN 2 -12 Normal, Normal symmetric reflexes. Normal coordination and gait  Psych: Alert, Mood appears warm and dry his cellulitis right lower extremity improving stable.    Assessment:    Annual wellness medicare exam    Plan:    During the course of the visit the patient was educated and counseled about appropriate screening and preventive services including:   No longer reasonable to do mammogram at her age or colon cancer screening.  Recommend annual flu vaccine.     Patient Instructions (the written plan) was given to the patient.  Medicare Attestation  I have personally reviewed:  The patient's medical and social history  Their use of alcohol, tobacco or illicit drugs  Their current medications and supplements  The patient's functional ability including ADLs,fall risks, home safety risks, cognitive, and hearing and visual impairment  Diet and physical activities  Evidence for depression or mood disorders  The patient's weight, height, BMI, and visual acuity have been recorded in the chart. I have made referrals, counseling, and provided education to the patient based on review of the above and I have provided the patient with a written personalized care plan for preventive services.

## 2017-06-14 LAB — VITAMIN B12: VITAMIN B 12: 815 pg/mL (ref 200–1100)

## 2017-06-14 LAB — IRON,TIBC AND FERRITIN PANEL
%SAT: 13 % (calc) (ref 11–50)
Ferritin: 46 ng/mL (ref 20–288)
Iron: 40 ug/dL — ABNORMAL LOW (ref 45–160)
TIBC: 303 mcg/dL (calc) (ref 250–450)

## 2017-06-14 LAB — FOLATE: Folate: 23.2 ng/mL

## 2017-06-22 DIAGNOSIS — F331 Major depressive disorder, recurrent, moderate: Secondary | ICD-10-CM | POA: Diagnosis not present

## 2017-06-22 DIAGNOSIS — F411 Generalized anxiety disorder: Secondary | ICD-10-CM | POA: Diagnosis not present

## 2017-06-30 DIAGNOSIS — R600 Localized edema: Secondary | ICD-10-CM | POA: Diagnosis not present

## 2017-06-30 DIAGNOSIS — J9611 Chronic respiratory failure with hypoxia: Secondary | ICD-10-CM | POA: Diagnosis not present

## 2017-06-30 DIAGNOSIS — B372 Candidiasis of skin and nail: Secondary | ICD-10-CM | POA: Diagnosis not present

## 2017-06-30 DIAGNOSIS — I10 Essential (primary) hypertension: Secondary | ICD-10-CM | POA: Diagnosis not present

## 2017-06-30 DIAGNOSIS — R269 Unspecified abnormalities of gait and mobility: Secondary | ICD-10-CM | POA: Diagnosis not present

## 2017-07-07 DIAGNOSIS — F331 Major depressive disorder, recurrent, moderate: Secondary | ICD-10-CM | POA: Diagnosis not present

## 2017-07-07 DIAGNOSIS — I10 Essential (primary) hypertension: Secondary | ICD-10-CM | POA: Diagnosis not present

## 2017-07-07 DIAGNOSIS — F411 Generalized anxiety disorder: Secondary | ICD-10-CM | POA: Diagnosis not present

## 2017-07-07 DIAGNOSIS — M7989 Other specified soft tissue disorders: Secondary | ICD-10-CM | POA: Diagnosis not present

## 2017-07-11 NOTE — Patient Instructions (Addendum)
It was a pleasure to see you today.  Increase Keflex to 3 times daily for cellulitis.  Continue current medications as previously prescribed.  Nizoral prescribed for intertrigo.  Please make sure you take an iron supplement.

## 2017-07-19 DIAGNOSIS — F411 Generalized anxiety disorder: Secondary | ICD-10-CM | POA: Diagnosis not present

## 2017-07-19 DIAGNOSIS — F331 Major depressive disorder, recurrent, moderate: Secondary | ICD-10-CM | POA: Diagnosis not present

## 2017-08-02 DIAGNOSIS — F331 Major depressive disorder, recurrent, moderate: Secondary | ICD-10-CM | POA: Diagnosis not present

## 2017-08-02 DIAGNOSIS — F411 Generalized anxiety disorder: Secondary | ICD-10-CM | POA: Diagnosis not present

## 2017-08-12 DIAGNOSIS — I10 Essential (primary) hypertension: Secondary | ICD-10-CM | POA: Diagnosis not present

## 2017-08-12 DIAGNOSIS — M7989 Other specified soft tissue disorders: Secondary | ICD-10-CM | POA: Diagnosis not present

## 2017-08-17 DIAGNOSIS — L97511 Non-pressure chronic ulcer of other part of right foot limited to breakdown of skin: Secondary | ICD-10-CM | POA: Diagnosis not present

## 2017-08-17 DIAGNOSIS — L84 Corns and callosities: Secondary | ICD-10-CM | POA: Diagnosis not present

## 2017-08-17 DIAGNOSIS — B351 Tinea unguium: Secondary | ICD-10-CM | POA: Diagnosis not present

## 2017-08-18 DIAGNOSIS — F331 Major depressive disorder, recurrent, moderate: Secondary | ICD-10-CM | POA: Diagnosis not present

## 2017-08-18 DIAGNOSIS — F411 Generalized anxiety disorder: Secondary | ICD-10-CM | POA: Diagnosis not present

## 2017-08-25 DIAGNOSIS — F331 Major depressive disorder, recurrent, moderate: Secondary | ICD-10-CM | POA: Diagnosis not present

## 2017-08-25 DIAGNOSIS — F411 Generalized anxiety disorder: Secondary | ICD-10-CM | POA: Diagnosis not present

## 2017-09-02 DIAGNOSIS — R600 Localized edema: Secondary | ICD-10-CM | POA: Diagnosis not present

## 2017-09-02 DIAGNOSIS — I10 Essential (primary) hypertension: Secondary | ICD-10-CM | POA: Diagnosis not present

## 2017-09-02 DIAGNOSIS — L0292 Furuncle, unspecified: Secondary | ICD-10-CM | POA: Diagnosis not present

## 2017-09-02 DIAGNOSIS — R269 Unspecified abnormalities of gait and mobility: Secondary | ICD-10-CM | POA: Diagnosis not present

## 2017-09-05 DIAGNOSIS — R600 Localized edema: Secondary | ICD-10-CM | POA: Diagnosis not present

## 2017-09-05 DIAGNOSIS — J9611 Chronic respiratory failure with hypoxia: Secondary | ICD-10-CM | POA: Diagnosis not present

## 2017-09-05 DIAGNOSIS — L0292 Furuncle, unspecified: Secondary | ICD-10-CM | POA: Diagnosis not present

## 2017-09-05 DIAGNOSIS — G894 Chronic pain syndrome: Secondary | ICD-10-CM | POA: Diagnosis not present

## 2017-09-05 DIAGNOSIS — R269 Unspecified abnormalities of gait and mobility: Secondary | ICD-10-CM | POA: Diagnosis not present

## 2017-09-07 DIAGNOSIS — F331 Major depressive disorder, recurrent, moderate: Secondary | ICD-10-CM | POA: Diagnosis not present

## 2017-09-07 DIAGNOSIS — F411 Generalized anxiety disorder: Secondary | ICD-10-CM | POA: Diagnosis not present

## 2017-09-09 DIAGNOSIS — L0232 Furuncle of buttock: Secondary | ICD-10-CM | POA: Diagnosis not present

## 2017-09-09 DIAGNOSIS — E039 Hypothyroidism, unspecified: Secondary | ICD-10-CM | POA: Diagnosis not present

## 2017-09-09 DIAGNOSIS — F0280 Dementia in other diseases classified elsewhere without behavioral disturbance: Secondary | ICD-10-CM | POA: Diagnosis not present

## 2017-09-09 DIAGNOSIS — M4800 Spinal stenosis, site unspecified: Secondary | ICD-10-CM | POA: Diagnosis not present

## 2017-09-09 DIAGNOSIS — J9611 Chronic respiratory failure with hypoxia: Secondary | ICD-10-CM | POA: Diagnosis not present

## 2017-09-09 DIAGNOSIS — Z9181 History of falling: Secondary | ICD-10-CM | POA: Diagnosis not present

## 2017-09-09 DIAGNOSIS — B372 Candidiasis of skin and nail: Secondary | ICD-10-CM | POA: Diagnosis not present

## 2017-09-09 DIAGNOSIS — Z87891 Personal history of nicotine dependence: Secondary | ICD-10-CM | POA: Diagnosis not present

## 2017-09-09 DIAGNOSIS — M1991 Primary osteoarthritis, unspecified site: Secondary | ICD-10-CM | POA: Diagnosis not present

## 2017-09-09 DIAGNOSIS — I10 Essential (primary) hypertension: Secondary | ICD-10-CM | POA: Diagnosis not present

## 2017-09-10 DIAGNOSIS — M4800 Spinal stenosis, site unspecified: Secondary | ICD-10-CM | POA: Diagnosis not present

## 2017-09-10 DIAGNOSIS — L0232 Furuncle of buttock: Secondary | ICD-10-CM | POA: Diagnosis not present

## 2017-09-10 DIAGNOSIS — I10 Essential (primary) hypertension: Secondary | ICD-10-CM | POA: Diagnosis not present

## 2017-09-10 DIAGNOSIS — E039 Hypothyroidism, unspecified: Secondary | ICD-10-CM | POA: Diagnosis not present

## 2017-09-10 DIAGNOSIS — J9611 Chronic respiratory failure with hypoxia: Secondary | ICD-10-CM | POA: Diagnosis not present

## 2017-09-10 DIAGNOSIS — Z87891 Personal history of nicotine dependence: Secondary | ICD-10-CM | POA: Diagnosis not present

## 2017-09-10 DIAGNOSIS — M1991 Primary osteoarthritis, unspecified site: Secondary | ICD-10-CM | POA: Diagnosis not present

## 2017-09-10 DIAGNOSIS — Z9181 History of falling: Secondary | ICD-10-CM | POA: Diagnosis not present

## 2017-09-10 DIAGNOSIS — B372 Candidiasis of skin and nail: Secondary | ICD-10-CM | POA: Diagnosis not present

## 2017-09-10 DIAGNOSIS — F0280 Dementia in other diseases classified elsewhere without behavioral disturbance: Secondary | ICD-10-CM | POA: Diagnosis not present

## 2017-09-19 DIAGNOSIS — B372 Candidiasis of skin and nail: Secondary | ICD-10-CM | POA: Diagnosis not present

## 2017-09-19 DIAGNOSIS — M4800 Spinal stenosis, site unspecified: Secondary | ICD-10-CM | POA: Diagnosis not present

## 2017-09-19 DIAGNOSIS — E039 Hypothyroidism, unspecified: Secondary | ICD-10-CM | POA: Diagnosis not present

## 2017-09-19 DIAGNOSIS — I10 Essential (primary) hypertension: Secondary | ICD-10-CM | POA: Diagnosis not present

## 2017-09-19 DIAGNOSIS — Z9181 History of falling: Secondary | ICD-10-CM | POA: Diagnosis not present

## 2017-09-19 DIAGNOSIS — Z87891 Personal history of nicotine dependence: Secondary | ICD-10-CM | POA: Diagnosis not present

## 2017-09-19 DIAGNOSIS — M1991 Primary osteoarthritis, unspecified site: Secondary | ICD-10-CM | POA: Diagnosis not present

## 2017-09-19 DIAGNOSIS — F0280 Dementia in other diseases classified elsewhere without behavioral disturbance: Secondary | ICD-10-CM | POA: Diagnosis not present

## 2017-09-19 DIAGNOSIS — L0232 Furuncle of buttock: Secondary | ICD-10-CM | POA: Diagnosis not present

## 2017-09-19 DIAGNOSIS — J9611 Chronic respiratory failure with hypoxia: Secondary | ICD-10-CM | POA: Diagnosis not present

## 2017-09-22 DIAGNOSIS — F411 Generalized anxiety disorder: Secondary | ICD-10-CM | POA: Diagnosis not present

## 2017-09-22 DIAGNOSIS — F331 Major depressive disorder, recurrent, moderate: Secondary | ICD-10-CM | POA: Diagnosis not present

## 2017-09-26 DIAGNOSIS — Z87891 Personal history of nicotine dependence: Secondary | ICD-10-CM | POA: Diagnosis not present

## 2017-09-26 DIAGNOSIS — L0232 Furuncle of buttock: Secondary | ICD-10-CM | POA: Diagnosis not present

## 2017-09-26 DIAGNOSIS — Z9181 History of falling: Secondary | ICD-10-CM | POA: Diagnosis not present

## 2017-09-26 DIAGNOSIS — I10 Essential (primary) hypertension: Secondary | ICD-10-CM | POA: Diagnosis not present

## 2017-09-26 DIAGNOSIS — M1991 Primary osteoarthritis, unspecified site: Secondary | ICD-10-CM | POA: Diagnosis not present

## 2017-09-26 DIAGNOSIS — J9611 Chronic respiratory failure with hypoxia: Secondary | ICD-10-CM | POA: Diagnosis not present

## 2017-09-26 DIAGNOSIS — F0280 Dementia in other diseases classified elsewhere without behavioral disturbance: Secondary | ICD-10-CM | POA: Diagnosis not present

## 2017-09-26 DIAGNOSIS — E039 Hypothyroidism, unspecified: Secondary | ICD-10-CM | POA: Diagnosis not present

## 2017-09-26 DIAGNOSIS — B372 Candidiasis of skin and nail: Secondary | ICD-10-CM | POA: Diagnosis not present

## 2017-09-26 DIAGNOSIS — M4800 Spinal stenosis, site unspecified: Secondary | ICD-10-CM | POA: Diagnosis not present

## 2017-09-28 DIAGNOSIS — J9611 Chronic respiratory failure with hypoxia: Secondary | ICD-10-CM | POA: Diagnosis not present

## 2017-09-28 DIAGNOSIS — Z9181 History of falling: Secondary | ICD-10-CM | POA: Diagnosis not present

## 2017-09-28 DIAGNOSIS — I10 Essential (primary) hypertension: Secondary | ICD-10-CM | POA: Diagnosis not present

## 2017-09-28 DIAGNOSIS — F0280 Dementia in other diseases classified elsewhere without behavioral disturbance: Secondary | ICD-10-CM | POA: Diagnosis not present

## 2017-09-28 DIAGNOSIS — M1991 Primary osteoarthritis, unspecified site: Secondary | ICD-10-CM | POA: Diagnosis not present

## 2017-09-28 DIAGNOSIS — E039 Hypothyroidism, unspecified: Secondary | ICD-10-CM | POA: Diagnosis not present

## 2017-09-28 DIAGNOSIS — L0232 Furuncle of buttock: Secondary | ICD-10-CM | POA: Diagnosis not present

## 2017-09-28 DIAGNOSIS — Z87891 Personal history of nicotine dependence: Secondary | ICD-10-CM | POA: Diagnosis not present

## 2017-09-28 DIAGNOSIS — B372 Candidiasis of skin and nail: Secondary | ICD-10-CM | POA: Diagnosis not present

## 2017-09-28 DIAGNOSIS — M4800 Spinal stenosis, site unspecified: Secondary | ICD-10-CM | POA: Diagnosis not present

## 2017-10-01 DIAGNOSIS — L0232 Furuncle of buttock: Secondary | ICD-10-CM | POA: Diagnosis not present

## 2017-10-01 DIAGNOSIS — B372 Candidiasis of skin and nail: Secondary | ICD-10-CM | POA: Diagnosis not present

## 2017-10-01 DIAGNOSIS — I10 Essential (primary) hypertension: Secondary | ICD-10-CM | POA: Diagnosis not present

## 2017-10-01 DIAGNOSIS — M1991 Primary osteoarthritis, unspecified site: Secondary | ICD-10-CM | POA: Diagnosis not present

## 2017-10-01 DIAGNOSIS — Z9181 History of falling: Secondary | ICD-10-CM | POA: Diagnosis not present

## 2017-10-01 DIAGNOSIS — Z87891 Personal history of nicotine dependence: Secondary | ICD-10-CM | POA: Diagnosis not present

## 2017-10-01 DIAGNOSIS — M4800 Spinal stenosis, site unspecified: Secondary | ICD-10-CM | POA: Diagnosis not present

## 2017-10-01 DIAGNOSIS — J9611 Chronic respiratory failure with hypoxia: Secondary | ICD-10-CM | POA: Diagnosis not present

## 2017-10-01 DIAGNOSIS — E039 Hypothyroidism, unspecified: Secondary | ICD-10-CM | POA: Diagnosis not present

## 2017-10-01 DIAGNOSIS — F0280 Dementia in other diseases classified elsewhere without behavioral disturbance: Secondary | ICD-10-CM | POA: Diagnosis not present

## 2017-10-03 DIAGNOSIS — B372 Candidiasis of skin and nail: Secondary | ICD-10-CM | POA: Diagnosis not present

## 2017-10-03 DIAGNOSIS — Z9181 History of falling: Secondary | ICD-10-CM | POA: Diagnosis not present

## 2017-10-03 DIAGNOSIS — L0232 Furuncle of buttock: Secondary | ICD-10-CM | POA: Diagnosis not present

## 2017-10-03 DIAGNOSIS — F0280 Dementia in other diseases classified elsewhere without behavioral disturbance: Secondary | ICD-10-CM | POA: Diagnosis not present

## 2017-10-03 DIAGNOSIS — E039 Hypothyroidism, unspecified: Secondary | ICD-10-CM | POA: Diagnosis not present

## 2017-10-03 DIAGNOSIS — J9611 Chronic respiratory failure with hypoxia: Secondary | ICD-10-CM | POA: Diagnosis not present

## 2017-10-03 DIAGNOSIS — M4800 Spinal stenosis, site unspecified: Secondary | ICD-10-CM | POA: Diagnosis not present

## 2017-10-03 DIAGNOSIS — I10 Essential (primary) hypertension: Secondary | ICD-10-CM | POA: Diagnosis not present

## 2017-10-03 DIAGNOSIS — Z87891 Personal history of nicotine dependence: Secondary | ICD-10-CM | POA: Diagnosis not present

## 2017-10-03 DIAGNOSIS — M1991 Primary osteoarthritis, unspecified site: Secondary | ICD-10-CM | POA: Diagnosis not present

## 2017-10-05 DIAGNOSIS — M79672 Pain in left foot: Secondary | ICD-10-CM | POA: Diagnosis not present

## 2017-10-05 DIAGNOSIS — L89893 Pressure ulcer of other site, stage 3: Secondary | ICD-10-CM | POA: Diagnosis not present

## 2017-10-06 DIAGNOSIS — F331 Major depressive disorder, recurrent, moderate: Secondary | ICD-10-CM | POA: Diagnosis not present

## 2017-10-06 DIAGNOSIS — F411 Generalized anxiety disorder: Secondary | ICD-10-CM | POA: Diagnosis not present

## 2017-10-10 DIAGNOSIS — Z87891 Personal history of nicotine dependence: Secondary | ICD-10-CM | POA: Diagnosis not present

## 2017-10-10 DIAGNOSIS — I10 Essential (primary) hypertension: Secondary | ICD-10-CM | POA: Diagnosis not present

## 2017-10-10 DIAGNOSIS — M1991 Primary osteoarthritis, unspecified site: Secondary | ICD-10-CM | POA: Diagnosis not present

## 2017-10-10 DIAGNOSIS — Z9181 History of falling: Secondary | ICD-10-CM | POA: Diagnosis not present

## 2017-10-10 DIAGNOSIS — E039 Hypothyroidism, unspecified: Secondary | ICD-10-CM | POA: Diagnosis not present

## 2017-10-10 DIAGNOSIS — M4800 Spinal stenosis, site unspecified: Secondary | ICD-10-CM | POA: Diagnosis not present

## 2017-10-10 DIAGNOSIS — L0232 Furuncle of buttock: Secondary | ICD-10-CM | POA: Diagnosis not present

## 2017-10-10 DIAGNOSIS — F0280 Dementia in other diseases classified elsewhere without behavioral disturbance: Secondary | ICD-10-CM | POA: Diagnosis not present

## 2017-10-10 DIAGNOSIS — B372 Candidiasis of skin and nail: Secondary | ICD-10-CM | POA: Diagnosis not present

## 2017-10-10 DIAGNOSIS — J9611 Chronic respiratory failure with hypoxia: Secondary | ICD-10-CM | POA: Diagnosis not present

## 2017-10-12 DIAGNOSIS — I10 Essential (primary) hypertension: Secondary | ICD-10-CM | POA: Diagnosis not present

## 2017-10-13 ENCOUNTER — Encounter: Payer: Self-pay | Admitting: Podiatry

## 2017-10-13 ENCOUNTER — Ambulatory Visit: Payer: PPO | Admitting: Podiatry

## 2017-10-13 VITALS — Temp 99.0°F

## 2017-10-13 DIAGNOSIS — M2042 Other hammer toe(s) (acquired), left foot: Secondary | ICD-10-CM | POA: Diagnosis not present

## 2017-10-13 DIAGNOSIS — I739 Peripheral vascular disease, unspecified: Secondary | ICD-10-CM | POA: Diagnosis not present

## 2017-10-13 DIAGNOSIS — L97521 Non-pressure chronic ulcer of other part of left foot limited to breakdown of skin: Secondary | ICD-10-CM

## 2017-10-13 DIAGNOSIS — M2041 Other hammer toe(s) (acquired), right foot: Secondary | ICD-10-CM | POA: Diagnosis not present

## 2017-10-13 NOTE — Progress Notes (Signed)
   Subjective:    Patient ID: Kelly Irwin, female    DOB: [birth year], 84 y.o.   MRN: [id number]  HPI 84 year old female presents the office today with her daughter for concerns of chronic recurrent wound to the left second toe.  They are presenting today for second opinion for possible amputation of the toe as his toe is been an ongoing issue for quite some time.  States that the wound is slow to heal but then will heal in the come right back.  She appears that the x-rays and she has been on antibiotics previously but not on any antibiotics.  She is currently putting Santyl on the wound daily.  Her daughter states that the wound is looking better over the last week.   Review of Systems  All other systems reviewed and are negative.      Objective:   Physical Exam  General: AAO x3, NAD  Dermatological: Superficial ulceration present to left dorsal second PIPJ with a fibrotic base.  There is mild edema and erythema but overall it appears to be improved compared to a picture that I saw previously.  There is no fluctuation or crepitation there is no malodor.  There is no drainage or pus.  No increase in warmth.  No other open lesions identified at this time.  Vascular: DP, PT pulses decreased bilaterally.  Neruologic: Sensation decreased with Derrel Nip monofilament.  Musculoskeletal: Digital deformities are present.  There is hammertoe contracture as well as transverse plane deformity present of the toe which is resulting in hyperkeratotic, wound lesion.  Walks with a walker         Assessment & Plan:  Chronic recurrent ulceration left second toe -Treatment options discussed including all alternatives, risks, and complications -Etiology of symptoms were discussed -I reviewed the x-ray report that she brought in.  Overall the wound is improving compared to the pictures I saw.  Continue Santyl dressing changes and offloading.  She cannot wear surgical shoe her daughter states  because of fear of falling they have made modifications to her shoe and the cut a hole in her shoe to offload the area.  I did an ABI in the office which was abnormal in the left side.  Because of this I ordered an ABI, TBI with arterial duplex. -She is following up with another podiatrist at this time and will continue to see her for her routine care.  Otherwise the she wants to proceed with surgery we can do that I will call her with results of the arterial studies.  Trula Slade DPM

## 2017-10-14 ENCOUNTER — Telehealth: Payer: Self-pay | Admitting: *Deleted

## 2017-10-14 DIAGNOSIS — R0989 Other specified symptoms and signs involving the circulatory and respiratory systems: Secondary | ICD-10-CM

## 2017-10-14 NOTE — Telephone Encounter (Addendum)
Dr. Jacqualyn Posey ordered ABI with TBI and arterial doppler, for diminished pulses and abnormal in-office ABI. Faxed the orders to [facility]. Faxed required form, clinicals and demographics with orders to [facility] [employer].

## 2017-10-17 DIAGNOSIS — E039 Hypothyroidism, unspecified: Secondary | ICD-10-CM | POA: Diagnosis not present

## 2017-10-17 DIAGNOSIS — Z87891 Personal history of nicotine dependence: Secondary | ICD-10-CM | POA: Diagnosis not present

## 2017-10-17 DIAGNOSIS — I10 Essential (primary) hypertension: Secondary | ICD-10-CM | POA: Diagnosis not present

## 2017-10-17 DIAGNOSIS — M4800 Spinal stenosis, site unspecified: Secondary | ICD-10-CM | POA: Diagnosis not present

## 2017-10-17 DIAGNOSIS — J9611 Chronic respiratory failure with hypoxia: Secondary | ICD-10-CM | POA: Diagnosis not present

## 2017-10-17 DIAGNOSIS — L0232 Furuncle of buttock: Secondary | ICD-10-CM | POA: Diagnosis not present

## 2017-10-17 DIAGNOSIS — Z9181 History of falling: Secondary | ICD-10-CM | POA: Diagnosis not present

## 2017-10-17 DIAGNOSIS — B372 Candidiasis of skin and nail: Secondary | ICD-10-CM | POA: Diagnosis not present

## 2017-10-17 DIAGNOSIS — F0280 Dementia in other diseases classified elsewhere without behavioral disturbance: Secondary | ICD-10-CM | POA: Diagnosis not present

## 2017-10-17 DIAGNOSIS — M1991 Primary osteoarthritis, unspecified site: Secondary | ICD-10-CM | POA: Diagnosis not present

## 2017-10-19 DIAGNOSIS — B351 Tinea unguium: Secondary | ICD-10-CM | POA: Diagnosis not present

## 2017-10-19 DIAGNOSIS — L89893 Pressure ulcer of other site, stage 3: Secondary | ICD-10-CM | POA: Diagnosis not present

## 2017-10-24 DIAGNOSIS — F411 Generalized anxiety disorder: Secondary | ICD-10-CM | POA: Diagnosis not present

## 2017-10-24 DIAGNOSIS — F331 Major depressive disorder, recurrent, moderate: Secondary | ICD-10-CM | POA: Diagnosis not present

## 2017-10-25 DIAGNOSIS — I10 Essential (primary) hypertension: Secondary | ICD-10-CM | POA: Diagnosis not present

## 2017-10-25 DIAGNOSIS — E039 Hypothyroidism, unspecified: Secondary | ICD-10-CM | POA: Diagnosis not present

## 2017-10-25 DIAGNOSIS — M1991 Primary osteoarthritis, unspecified site: Secondary | ICD-10-CM | POA: Diagnosis not present

## 2017-10-25 DIAGNOSIS — L0292 Furuncle, unspecified: Secondary | ICD-10-CM | POA: Diagnosis not present

## 2017-10-25 DIAGNOSIS — M4800 Spinal stenosis, site unspecified: Secondary | ICD-10-CM | POA: Diagnosis not present

## 2017-10-25 DIAGNOSIS — F0280 Dementia in other diseases classified elsewhere without behavioral disturbance: Secondary | ICD-10-CM | POA: Diagnosis not present

## 2017-10-25 DIAGNOSIS — R600 Localized edema: Secondary | ICD-10-CM | POA: Diagnosis not present

## 2017-10-25 DIAGNOSIS — L0232 Furuncle of buttock: Secondary | ICD-10-CM | POA: Diagnosis not present

## 2017-10-25 DIAGNOSIS — Z9181 History of falling: Secondary | ICD-10-CM | POA: Diagnosis not present

## 2017-10-25 DIAGNOSIS — J9611 Chronic respiratory failure with hypoxia: Secondary | ICD-10-CM | POA: Diagnosis not present

## 2017-10-25 DIAGNOSIS — R269 Unspecified abnormalities of gait and mobility: Secondary | ICD-10-CM | POA: Diagnosis not present

## 2017-10-25 DIAGNOSIS — G894 Chronic pain syndrome: Secondary | ICD-10-CM | POA: Diagnosis not present

## 2017-10-25 DIAGNOSIS — B372 Candidiasis of skin and nail: Secondary | ICD-10-CM | POA: Diagnosis not present

## 2017-10-25 DIAGNOSIS — Z87891 Personal history of nicotine dependence: Secondary | ICD-10-CM | POA: Diagnosis not present

## 2017-10-25 DIAGNOSIS — L97509 Non-pressure chronic ulcer of other part of unspecified foot with unspecified severity: Secondary | ICD-10-CM | POA: Diagnosis not present

## 2017-10-26 ENCOUNTER — Ambulatory Visit (HOSPITAL_COMMUNITY)
Admission: RE | Admit: 2017-10-26 | Discharge: 2017-10-26 | Disposition: A | Discharge status: Home / Self Care | Payer: PPO | Source: Ambulatory Visit | Attending: Vascular Surgery | Admitting: Vascular Surgery

## 2017-10-26 ENCOUNTER — Telehealth: Payer: Self-pay

## 2017-10-26 ENCOUNTER — Ambulatory Visit (INDEPENDENT_AMBULATORY_CARE_PROVIDER_SITE_OTHER)
Admission: RE | Admit: 2017-10-26 | Discharge: 2017-10-26 | Disposition: A | Discharge status: Home / Self Care | Payer: PPO | Source: Ambulatory Visit | Attending: Vascular Surgery | Admitting: Vascular Surgery

## 2017-10-26 DIAGNOSIS — L97529 Non-pressure chronic ulcer of other part of left foot with unspecified severity: Secondary | ICD-10-CM | POA: Insufficient documentation

## 2017-10-26 DIAGNOSIS — R9389 Abnormal findings on diagnostic imaging of other specified body structures: Secondary | ICD-10-CM | POA: Diagnosis not present

## 2017-10-26 DIAGNOSIS — I70203 Unspecified atherosclerosis of native arteries of extremities, bilateral legs: Secondary | ICD-10-CM | POA: Diagnosis not present

## 2017-10-26 DIAGNOSIS — I10 Essential (primary) hypertension: Secondary | ICD-10-CM | POA: Insufficient documentation

## 2017-10-26 DIAGNOSIS — R0989 Other specified symptoms and signs involving the circulatory and respiratory systems: Secondary | ICD-10-CM

## 2017-10-26 DIAGNOSIS — Z87891 Personal history of nicotine dependence: Secondary | ICD-10-CM | POA: Diagnosis not present

## 2017-10-26 NOTE — Telephone Encounter (Signed)
-----   Message from Trula Slade, DPM sent at 10/26/2017  1:55 PM EDT ----- Janett Billow- can you please let her or her daughter know that there is stenosis in arteries in both legs and the left is worse than the right. Because of this I would probably hold off on elective amputation of the toe since the wound is getting better and not infected. Please let them know but also I would recommend a vascular consult if you could please order that if she has not followed up with them for this wound. Thanks.

## 2017-10-26 NOTE — Telephone Encounter (Signed)
Left voicemail patient's daughters cell phone asking her to return our phone call to discuss vascular ultrasound results and future plans to manage patient's wound.

## 2017-10-27 ENCOUNTER — Telehealth: Payer: Self-pay | Admitting: Podiatry

## 2017-10-27 NOTE — Telephone Encounter (Signed)
Spoke with patient's daughter in regards to vascular results.  Told her that Dr. Jacqualyn Posey be more than happy to continue seeing her mother to monitor the wound on her foot.  She did state that her mother is followed by Dr. Buelah Manis, a podiatrist, who comes to the facility from time to time.  Daughter states that she will keep an eye on the wound and call with any acute symptom changes or if infection is suspected.  I did discuss in detail the lower extremity arterial duplex study results and Dr. Leigh Aurora recommendation to hold off on elective amputation of the toe since the wound is getting better and not infected.

## 2017-10-27 NOTE — Telephone Encounter (Signed)
This is Dr. Dillard Essex returning Janett Billow, RN's call from yesterday in regards to my mother. She can call me back at [phone]. Thank you.

## 2017-10-31 DIAGNOSIS — I10 Essential (primary) hypertension: Secondary | ICD-10-CM | POA: Diagnosis not present

## 2017-10-31 DIAGNOSIS — M1991 Primary osteoarthritis, unspecified site: Secondary | ICD-10-CM | POA: Diagnosis not present

## 2017-10-31 DIAGNOSIS — J9611 Chronic respiratory failure with hypoxia: Secondary | ICD-10-CM | POA: Diagnosis not present

## 2017-10-31 DIAGNOSIS — Z9181 History of falling: Secondary | ICD-10-CM | POA: Diagnosis not present

## 2017-10-31 DIAGNOSIS — M4800 Spinal stenosis, site unspecified: Secondary | ICD-10-CM | POA: Diagnosis not present

## 2017-10-31 DIAGNOSIS — B372 Candidiasis of skin and nail: Secondary | ICD-10-CM | POA: Diagnosis not present

## 2017-10-31 DIAGNOSIS — F0280 Dementia in other diseases classified elsewhere without behavioral disturbance: Secondary | ICD-10-CM | POA: Diagnosis not present

## 2017-10-31 DIAGNOSIS — L0232 Furuncle of buttock: Secondary | ICD-10-CM | POA: Diagnosis not present

## 2017-10-31 DIAGNOSIS — E039 Hypothyroidism, unspecified: Secondary | ICD-10-CM | POA: Diagnosis not present

## 2017-10-31 DIAGNOSIS — Z87891 Personal history of nicotine dependence: Secondary | ICD-10-CM | POA: Diagnosis not present

## 2017-11-03 DIAGNOSIS — E039 Hypothyroidism, unspecified: Secondary | ICD-10-CM | POA: Diagnosis not present

## 2017-11-03 DIAGNOSIS — Z9181 History of falling: Secondary | ICD-10-CM | POA: Diagnosis not present

## 2017-11-03 DIAGNOSIS — M1991 Primary osteoarthritis, unspecified site: Secondary | ICD-10-CM | POA: Diagnosis not present

## 2017-11-03 DIAGNOSIS — F0280 Dementia in other diseases classified elsewhere without behavioral disturbance: Secondary | ICD-10-CM | POA: Diagnosis not present

## 2017-11-03 DIAGNOSIS — I10 Essential (primary) hypertension: Secondary | ICD-10-CM | POA: Diagnosis not present

## 2017-11-03 DIAGNOSIS — B372 Candidiasis of skin and nail: Secondary | ICD-10-CM | POA: Diagnosis not present

## 2017-11-03 DIAGNOSIS — Z87891 Personal history of nicotine dependence: Secondary | ICD-10-CM | POA: Diagnosis not present

## 2017-11-03 DIAGNOSIS — J9611 Chronic respiratory failure with hypoxia: Secondary | ICD-10-CM | POA: Diagnosis not present

## 2017-11-03 DIAGNOSIS — L0232 Furuncle of buttock: Secondary | ICD-10-CM | POA: Diagnosis not present

## 2017-11-03 DIAGNOSIS — M4800 Spinal stenosis, site unspecified: Secondary | ICD-10-CM | POA: Diagnosis not present

## 2017-11-10 DIAGNOSIS — F331 Major depressive disorder, recurrent, moderate: Secondary | ICD-10-CM | POA: Diagnosis not present

## 2017-11-10 DIAGNOSIS — F411 Generalized anxiety disorder: Secondary | ICD-10-CM | POA: Diagnosis not present

## 2017-11-11 DIAGNOSIS — I10 Essential (primary) hypertension: Secondary | ICD-10-CM | POA: Diagnosis not present

## 2017-12-09 DIAGNOSIS — F411 Generalized anxiety disorder: Secondary | ICD-10-CM | POA: Diagnosis not present

## 2017-12-09 DIAGNOSIS — F331 Major depressive disorder, recurrent, moderate: Secondary | ICD-10-CM | POA: Diagnosis not present

## 2017-12-23 DIAGNOSIS — I10 Essential (primary) hypertension: Secondary | ICD-10-CM | POA: Diagnosis not present

## 2017-12-23 DIAGNOSIS — F419 Anxiety disorder, unspecified: Secondary | ICD-10-CM | POA: Diagnosis not present

## 2017-12-23 DIAGNOSIS — E039 Hypothyroidism, unspecified: Secondary | ICD-10-CM | POA: Diagnosis not present

## 2017-12-23 DIAGNOSIS — J9611 Chronic respiratory failure with hypoxia: Secondary | ICD-10-CM | POA: Diagnosis not present

## 2017-12-23 DIAGNOSIS — G894 Chronic pain syndrome: Secondary | ICD-10-CM | POA: Diagnosis not present

## 2017-12-23 DIAGNOSIS — J449 Chronic obstructive pulmonary disease, unspecified: Secondary | ICD-10-CM | POA: Diagnosis not present

## 2018-01-03 DIAGNOSIS — F331 Major depressive disorder, recurrent, moderate: Secondary | ICD-10-CM | POA: Diagnosis not present

## 2018-01-03 DIAGNOSIS — F411 Generalized anxiety disorder: Secondary | ICD-10-CM | POA: Diagnosis not present

## 2018-01-11 DIAGNOSIS — I10 Essential (primary) hypertension: Secondary | ICD-10-CM | POA: Diagnosis not present

## 2018-02-02 DIAGNOSIS — F411 Generalized anxiety disorder: Secondary | ICD-10-CM | POA: Diagnosis not present

## 2018-02-02 DIAGNOSIS — F331 Major depressive disorder, recurrent, moderate: Secondary | ICD-10-CM | POA: Diagnosis not present

## 2018-02-03 DIAGNOSIS — J9611 Chronic respiratory failure with hypoxia: Secondary | ICD-10-CM | POA: Diagnosis not present

## 2018-02-03 DIAGNOSIS — R600 Localized edema: Secondary | ICD-10-CM | POA: Diagnosis not present

## 2018-02-03 DIAGNOSIS — L8991 Pressure ulcer of unspecified site, stage 1: Secondary | ICD-10-CM | POA: Diagnosis not present

## 2018-02-03 DIAGNOSIS — G894 Chronic pain syndrome: Secondary | ICD-10-CM | POA: Diagnosis not present

## 2018-02-03 DIAGNOSIS — B372 Candidiasis of skin and nail: Secondary | ICD-10-CM | POA: Diagnosis not present

## 2018-02-03 DIAGNOSIS — F419 Anxiety disorder, unspecified: Secondary | ICD-10-CM | POA: Diagnosis not present

## 2018-02-03 DIAGNOSIS — I10 Essential (primary) hypertension: Secondary | ICD-10-CM | POA: Diagnosis not present

## 2018-02-11 DIAGNOSIS — I10 Essential (primary) hypertension: Secondary | ICD-10-CM | POA: Diagnosis not present

## 2018-02-21 DIAGNOSIS — F411 Generalized anxiety disorder: Secondary | ICD-10-CM | POA: Diagnosis not present

## 2018-02-21 DIAGNOSIS — F331 Major depressive disorder, recurrent, moderate: Secondary | ICD-10-CM | POA: Diagnosis not present

## 2018-03-10 NOTE — Progress Notes (Signed)
This encounter was created in error - please disregard.

## 2018-03-23 DIAGNOSIS — F411 Generalized anxiety disorder: Secondary | ICD-10-CM | POA: Diagnosis not present

## 2018-03-23 DIAGNOSIS — F331 Major depressive disorder, recurrent, moderate: Secondary | ICD-10-CM | POA: Diagnosis not present

## 2018-03-31 DIAGNOSIS — M6281 Muscle weakness (generalized): Secondary | ICD-10-CM | POA: Diagnosis not present

## 2018-03-31 DIAGNOSIS — I10 Essential (primary) hypertension: Secondary | ICD-10-CM | POA: Diagnosis not present

## 2018-03-31 DIAGNOSIS — F419 Anxiety disorder, unspecified: Secondary | ICD-10-CM | POA: Diagnosis not present

## 2018-03-31 DIAGNOSIS — L8991 Pressure ulcer of unspecified site, stage 1: Secondary | ICD-10-CM | POA: Diagnosis not present

## 2018-03-31 DIAGNOSIS — G894 Chronic pain syndrome: Secondary | ICD-10-CM | POA: Diagnosis not present

## 2018-04-06 DIAGNOSIS — F411 Generalized anxiety disorder: Secondary | ICD-10-CM | POA: Diagnosis not present

## 2018-04-06 DIAGNOSIS — F331 Major depressive disorder, recurrent, moderate: Secondary | ICD-10-CM | POA: Diagnosis not present

## 2018-04-20 DIAGNOSIS — F331 Major depressive disorder, recurrent, moderate: Secondary | ICD-10-CM | POA: Diagnosis not present

## 2018-04-20 DIAGNOSIS — F411 Generalized anxiety disorder: Secondary | ICD-10-CM | POA: Diagnosis not present

## 2018-04-24 DIAGNOSIS — L84 Corns and callosities: Secondary | ICD-10-CM | POA: Diagnosis not present

## 2018-04-24 DIAGNOSIS — B351 Tinea unguium: Secondary | ICD-10-CM | POA: Diagnosis not present

## 2018-05-18 DIAGNOSIS — F331 Major depressive disorder, recurrent, moderate: Secondary | ICD-10-CM | POA: Diagnosis not present

## 2018-05-18 DIAGNOSIS — F411 Generalized anxiety disorder: Secondary | ICD-10-CM | POA: Diagnosis not present

## 2018-06-07 DIAGNOSIS — G894 Chronic pain syndrome: Secondary | ICD-10-CM | POA: Diagnosis not present

## 2018-06-13 DIAGNOSIS — Z0000 Encounter for general adult medical examination without abnormal findings: Secondary | ICD-10-CM | POA: Diagnosis not present

## 2018-06-13 DIAGNOSIS — F331 Major depressive disorder, recurrent, moderate: Secondary | ICD-10-CM | POA: Diagnosis not present

## 2018-06-13 DIAGNOSIS — F411 Generalized anxiety disorder: Secondary | ICD-10-CM | POA: Diagnosis not present

## 2018-06-13 DIAGNOSIS — G894 Chronic pain syndrome: Secondary | ICD-10-CM | POA: Diagnosis not present

## 2018-06-13 DIAGNOSIS — L0292 Furuncle, unspecified: Secondary | ICD-10-CM | POA: Diagnosis not present

## 2018-06-27 DIAGNOSIS — F411 Generalized anxiety disorder: Secondary | ICD-10-CM | POA: Diagnosis not present

## 2018-06-27 DIAGNOSIS — F331 Major depressive disorder, recurrent, moderate: Secondary | ICD-10-CM | POA: Diagnosis not present

## 2018-07-11 DIAGNOSIS — F331 Major depressive disorder, recurrent, moderate: Secondary | ICD-10-CM | POA: Diagnosis not present

## 2018-07-11 DIAGNOSIS — F411 Generalized anxiety disorder: Secondary | ICD-10-CM | POA: Diagnosis not present

## 2018-07-27 DIAGNOSIS — F331 Major depressive disorder, recurrent, moderate: Secondary | ICD-10-CM | POA: Diagnosis not present

## 2018-07-27 DIAGNOSIS — F411 Generalized anxiety disorder: Secondary | ICD-10-CM | POA: Diagnosis not present

## 2018-08-18 DIAGNOSIS — G894 Chronic pain syndrome: Secondary | ICD-10-CM | POA: Diagnosis not present

## 2018-08-18 DIAGNOSIS — B372 Candidiasis of skin and nail: Secondary | ICD-10-CM | POA: Diagnosis not present

## 2018-08-18 DIAGNOSIS — K649 Unspecified hemorrhoids: Secondary | ICD-10-CM | POA: Diagnosis not present

## 2018-08-18 DIAGNOSIS — I10 Essential (primary) hypertension: Secondary | ICD-10-CM | POA: Diagnosis not present

## 2018-08-18 DIAGNOSIS — Z7189 Other specified counseling: Secondary | ICD-10-CM | POA: Diagnosis not present

## 2018-08-24 DIAGNOSIS — F411 Generalized anxiety disorder: Secondary | ICD-10-CM | POA: Diagnosis not present

## 2018-08-24 DIAGNOSIS — F331 Major depressive disorder, recurrent, moderate: Secondary | ICD-10-CM | POA: Diagnosis not present

## 2018-09-07 DIAGNOSIS — F331 Major depressive disorder, recurrent, moderate: Secondary | ICD-10-CM | POA: Diagnosis not present

## 2018-09-07 DIAGNOSIS — F411 Generalized anxiety disorder: Secondary | ICD-10-CM | POA: Diagnosis not present

## 2018-09-07 DIAGNOSIS — L84 Corns and callosities: Secondary | ICD-10-CM | POA: Diagnosis not present

## 2018-09-07 DIAGNOSIS — B351 Tinea unguium: Secondary | ICD-10-CM | POA: Diagnosis not present

## 2018-10-05 DIAGNOSIS — F331 Major depressive disorder, recurrent, moderate: Secondary | ICD-10-CM | POA: Diagnosis not present

## 2018-10-05 DIAGNOSIS — F411 Generalized anxiety disorder: Secondary | ICD-10-CM | POA: Diagnosis not present

## 2018-10-16 DIAGNOSIS — J42 Unspecified chronic bronchitis: Secondary | ICD-10-CM | POA: Diagnosis not present

## 2018-10-16 DIAGNOSIS — L0292 Furuncle, unspecified: Secondary | ICD-10-CM | POA: Diagnosis not present

## 2018-10-16 DIAGNOSIS — I10 Essential (primary) hypertension: Secondary | ICD-10-CM | POA: Diagnosis not present

## 2018-10-16 DIAGNOSIS — G894 Chronic pain syndrome: Secondary | ICD-10-CM | POA: Diagnosis not present

## 2018-10-19 DIAGNOSIS — F411 Generalized anxiety disorder: Secondary | ICD-10-CM | POA: Diagnosis not present

## 2018-10-19 DIAGNOSIS — F331 Major depressive disorder, recurrent, moderate: Secondary | ICD-10-CM | POA: Diagnosis not present

## 2018-11-02 DIAGNOSIS — F331 Major depressive disorder, recurrent, moderate: Secondary | ICD-10-CM | POA: Diagnosis not present

## 2018-11-02 DIAGNOSIS — F411 Generalized anxiety disorder: Secondary | ICD-10-CM | POA: Diagnosis not present

## 2018-11-16 DIAGNOSIS — F411 Generalized anxiety disorder: Secondary | ICD-10-CM | POA: Diagnosis not present

## 2018-11-16 DIAGNOSIS — F331 Major depressive disorder, recurrent, moderate: Secondary | ICD-10-CM | POA: Diagnosis not present

## 2018-11-30 DIAGNOSIS — F331 Major depressive disorder, recurrent, moderate: Secondary | ICD-10-CM | POA: Diagnosis not present

## 2018-11-30 DIAGNOSIS — F411 Generalized anxiety disorder: Secondary | ICD-10-CM | POA: Diagnosis not present

## 2018-12-06 DIAGNOSIS — B351 Tinea unguium: Secondary | ICD-10-CM | POA: Diagnosis not present

## 2018-12-06 DIAGNOSIS — L84 Corns and callosities: Secondary | ICD-10-CM | POA: Diagnosis not present

## 2018-12-14 DIAGNOSIS — F331 Major depressive disorder, recurrent, moderate: Secondary | ICD-10-CM | POA: Diagnosis not present

## 2018-12-14 DIAGNOSIS — F411 Generalized anxiety disorder: Secondary | ICD-10-CM | POA: Diagnosis not present

## 2018-12-15 DIAGNOSIS — L0292 Furuncle, unspecified: Secondary | ICD-10-CM | POA: Diagnosis not present

## 2018-12-15 DIAGNOSIS — J42 Unspecified chronic bronchitis: Secondary | ICD-10-CM | POA: Diagnosis not present

## 2018-12-15 DIAGNOSIS — G894 Chronic pain syndrome: Secondary | ICD-10-CM | POA: Diagnosis not present

## 2018-12-15 DIAGNOSIS — I10 Essential (primary) hypertension: Secondary | ICD-10-CM | POA: Diagnosis not present

## 2018-12-28 DIAGNOSIS — F331 Major depressive disorder, recurrent, moderate: Secondary | ICD-10-CM | POA: Diagnosis not present

## 2018-12-28 DIAGNOSIS — F411 Generalized anxiety disorder: Secondary | ICD-10-CM | POA: Diagnosis not present

## 2019-01-10 DIAGNOSIS — H524 Presbyopia: Secondary | ICD-10-CM | POA: Diagnosis not present

## 2019-01-11 DIAGNOSIS — F411 Generalized anxiety disorder: Secondary | ICD-10-CM | POA: Diagnosis not present

## 2019-01-11 DIAGNOSIS — F331 Major depressive disorder, recurrent, moderate: Secondary | ICD-10-CM | POA: Diagnosis not present

## 2019-01-25 DIAGNOSIS — F331 Major depressive disorder, recurrent, moderate: Secondary | ICD-10-CM | POA: Diagnosis not present

## 2019-01-25 DIAGNOSIS — F411 Generalized anxiety disorder: Secondary | ICD-10-CM | POA: Diagnosis not present

## 2019-02-15 DIAGNOSIS — F331 Major depressive disorder, recurrent, moderate: Secondary | ICD-10-CM | POA: Diagnosis not present

## 2019-02-15 DIAGNOSIS — F411 Generalized anxiety disorder: Secondary | ICD-10-CM | POA: Diagnosis not present

## 2019-02-17 DIAGNOSIS — B372 Candidiasis of skin and nail: Secondary | ICD-10-CM | POA: Diagnosis not present

## 2019-02-17 DIAGNOSIS — I10 Essential (primary) hypertension: Secondary | ICD-10-CM | POA: Diagnosis not present

## 2019-02-17 DIAGNOSIS — G894 Chronic pain syndrome: Secondary | ICD-10-CM | POA: Diagnosis not present

## 2019-02-17 DIAGNOSIS — J42 Unspecified chronic bronchitis: Secondary | ICD-10-CM | POA: Diagnosis not present

## 2019-02-26 DIAGNOSIS — F411 Generalized anxiety disorder: Secondary | ICD-10-CM | POA: Diagnosis not present

## 2019-02-26 DIAGNOSIS — F331 Major depressive disorder, recurrent, moderate: Secondary | ICD-10-CM | POA: Diagnosis not present

## 2019-02-28 DIAGNOSIS — Z20828 Contact with and (suspected) exposure to other viral communicable diseases: Secondary | ICD-10-CM | POA: Diagnosis not present

## 2019-02-28 DIAGNOSIS — Z1159 Encounter for screening for other viral diseases: Secondary | ICD-10-CM | POA: Diagnosis not present

## 2019-02-28 DIAGNOSIS — B351 Tinea unguium: Secondary | ICD-10-CM | POA: Diagnosis not present

## 2019-03-05 DIAGNOSIS — Z20828 Contact with and (suspected) exposure to other viral communicable diseases: Secondary | ICD-10-CM | POA: Diagnosis not present

## 2019-03-05 DIAGNOSIS — Z1159 Encounter for screening for other viral diseases: Secondary | ICD-10-CM | POA: Diagnosis not present

## 2019-03-12 DIAGNOSIS — Z1159 Encounter for screening for other viral diseases: Secondary | ICD-10-CM | POA: Diagnosis not present

## 2019-03-12 DIAGNOSIS — Z20828 Contact with and (suspected) exposure to other viral communicable diseases: Secondary | ICD-10-CM | POA: Diagnosis not present

## 2019-03-19 DIAGNOSIS — Z20828 Contact with and (suspected) exposure to other viral communicable diseases: Secondary | ICD-10-CM | POA: Diagnosis not present

## 2019-03-19 DIAGNOSIS — Z1159 Encounter for screening for other viral diseases: Secondary | ICD-10-CM | POA: Diagnosis not present

## 2019-03-22 DIAGNOSIS — F331 Major depressive disorder, recurrent, moderate: Secondary | ICD-10-CM | POA: Diagnosis not present

## 2019-03-22 DIAGNOSIS — Z1159 Encounter for screening for other viral diseases: Secondary | ICD-10-CM | POA: Diagnosis not present

## 2019-03-22 DIAGNOSIS — F411 Generalized anxiety disorder: Secondary | ICD-10-CM | POA: Diagnosis not present

## 2019-03-22 DIAGNOSIS — Z20828 Contact with and (suspected) exposure to other viral communicable diseases: Secondary | ICD-10-CM | POA: Diagnosis not present

## 2019-03-26 DIAGNOSIS — Z1159 Encounter for screening for other viral diseases: Secondary | ICD-10-CM | POA: Diagnosis not present

## 2019-03-26 DIAGNOSIS — Z20828 Contact with and (suspected) exposure to other viral communicable diseases: Secondary | ICD-10-CM | POA: Diagnosis not present

## 2019-03-29 DIAGNOSIS — Z1159 Encounter for screening for other viral diseases: Secondary | ICD-10-CM | POA: Diagnosis not present

## 2019-03-29 DIAGNOSIS — Z20828 Contact with and (suspected) exposure to other viral communicable diseases: Secondary | ICD-10-CM | POA: Diagnosis not present

## 2019-04-02 DIAGNOSIS — F411 Generalized anxiety disorder: Secondary | ICD-10-CM | POA: Diagnosis not present

## 2019-04-02 DIAGNOSIS — F331 Major depressive disorder, recurrent, moderate: Secondary | ICD-10-CM | POA: Diagnosis not present

## 2019-04-02 DIAGNOSIS — Z1159 Encounter for screening for other viral diseases: Secondary | ICD-10-CM | POA: Diagnosis not present

## 2019-04-02 DIAGNOSIS — Z20828 Contact with and (suspected) exposure to other viral communicable diseases: Secondary | ICD-10-CM | POA: Diagnosis not present

## 2019-04-05 DIAGNOSIS — Z20828 Contact with and (suspected) exposure to other viral communicable diseases: Secondary | ICD-10-CM | POA: Diagnosis not present

## 2019-04-05 DIAGNOSIS — Z1159 Encounter for screening for other viral diseases: Secondary | ICD-10-CM | POA: Diagnosis not present

## 2019-04-09 DIAGNOSIS — Z1159 Encounter for screening for other viral diseases: Secondary | ICD-10-CM | POA: Diagnosis not present

## 2019-04-09 DIAGNOSIS — Z20828 Contact with and (suspected) exposure to other viral communicable diseases: Secondary | ICD-10-CM | POA: Diagnosis not present

## 2019-04-12 DIAGNOSIS — Z20828 Contact with and (suspected) exposure to other viral communicable diseases: Secondary | ICD-10-CM | POA: Diagnosis not present

## 2019-04-12 DIAGNOSIS — Z1159 Encounter for screening for other viral diseases: Secondary | ICD-10-CM | POA: Diagnosis not present

## 2019-04-16 DIAGNOSIS — Z20828 Contact with and (suspected) exposure to other viral communicable diseases: Secondary | ICD-10-CM | POA: Diagnosis not present

## 2019-04-16 DIAGNOSIS — Z1159 Encounter for screening for other viral diseases: Secondary | ICD-10-CM | POA: Diagnosis not present

## 2019-04-19 DIAGNOSIS — F411 Generalized anxiety disorder: Secondary | ICD-10-CM | POA: Diagnosis not present

## 2019-04-19 DIAGNOSIS — Z20828 Contact with and (suspected) exposure to other viral communicable diseases: Secondary | ICD-10-CM | POA: Diagnosis not present

## 2019-04-19 DIAGNOSIS — Z1159 Encounter for screening for other viral diseases: Secondary | ICD-10-CM | POA: Diagnosis not present

## 2019-04-19 DIAGNOSIS — F331 Major depressive disorder, recurrent, moderate: Secondary | ICD-10-CM | POA: Diagnosis not present

## 2019-04-23 DIAGNOSIS — Z1159 Encounter for screening for other viral diseases: Secondary | ICD-10-CM | POA: Diagnosis not present

## 2019-04-23 DIAGNOSIS — Z20828 Contact with and (suspected) exposure to other viral communicable diseases: Secondary | ICD-10-CM | POA: Diagnosis not present

## 2019-04-26 DIAGNOSIS — Z1159 Encounter for screening for other viral diseases: Secondary | ICD-10-CM | POA: Diagnosis not present

## 2019-04-26 DIAGNOSIS — Z20828 Contact with and (suspected) exposure to other viral communicable diseases: Secondary | ICD-10-CM | POA: Diagnosis not present

## 2019-04-30 DIAGNOSIS — Z1159 Encounter for screening for other viral diseases: Secondary | ICD-10-CM | POA: Diagnosis not present

## 2019-04-30 DIAGNOSIS — Z20828 Contact with and (suspected) exposure to other viral communicable diseases: Secondary | ICD-10-CM | POA: Diagnosis not present

## 2019-05-03 DIAGNOSIS — Z1159 Encounter for screening for other viral diseases: Secondary | ICD-10-CM | POA: Diagnosis not present

## 2019-05-03 DIAGNOSIS — Z20828 Contact with and (suspected) exposure to other viral communicable diseases: Secondary | ICD-10-CM | POA: Diagnosis not present

## 2019-05-03 DIAGNOSIS — F331 Major depressive disorder, recurrent, moderate: Secondary | ICD-10-CM | POA: Diagnosis not present

## 2019-05-03 DIAGNOSIS — F411 Generalized anxiety disorder: Secondary | ICD-10-CM | POA: Diagnosis not present

## 2019-05-05 DIAGNOSIS — G894 Chronic pain syndrome: Secondary | ICD-10-CM | POA: Diagnosis not present

## 2019-05-05 DIAGNOSIS — R269 Unspecified abnormalities of gait and mobility: Secondary | ICD-10-CM | POA: Diagnosis not present

## 2019-05-05 DIAGNOSIS — B372 Candidiasis of skin and nail: Secondary | ICD-10-CM | POA: Diagnosis not present

## 2019-05-05 DIAGNOSIS — F419 Anxiety disorder, unspecified: Secondary | ICD-10-CM | POA: Diagnosis not present

## 2019-05-07 DIAGNOSIS — Z1159 Encounter for screening for other viral diseases: Secondary | ICD-10-CM | POA: Diagnosis not present

## 2019-05-07 DIAGNOSIS — Z20828 Contact with and (suspected) exposure to other viral communicable diseases: Secondary | ICD-10-CM | POA: Diagnosis not present

## 2019-05-10 DIAGNOSIS — Z20828 Contact with and (suspected) exposure to other viral communicable diseases: Secondary | ICD-10-CM | POA: Diagnosis not present

## 2019-05-10 DIAGNOSIS — Z1159 Encounter for screening for other viral diseases: Secondary | ICD-10-CM | POA: Diagnosis not present

## 2019-05-14 DIAGNOSIS — Z1159 Encounter for screening for other viral diseases: Secondary | ICD-10-CM | POA: Diagnosis not present

## 2019-05-14 DIAGNOSIS — Z20828 Contact with and (suspected) exposure to other viral communicable diseases: Secondary | ICD-10-CM | POA: Diagnosis not present

## 2019-05-15 DIAGNOSIS — L0100 Impetigo, unspecified: Secondary | ICD-10-CM | POA: Diagnosis not present

## 2019-05-15 DIAGNOSIS — L219 Seborrheic dermatitis, unspecified: Secondary | ICD-10-CM | POA: Diagnosis not present

## 2019-05-15 DIAGNOSIS — Z23 Encounter for immunization: Secondary | ICD-10-CM | POA: Diagnosis not present

## 2019-05-15 DIAGNOSIS — B372 Candidiasis of skin and nail: Secondary | ICD-10-CM | POA: Diagnosis not present

## 2019-05-17 DIAGNOSIS — F411 Generalized anxiety disorder: Secondary | ICD-10-CM | POA: Diagnosis not present

## 2019-05-17 DIAGNOSIS — F331 Major depressive disorder, recurrent, moderate: Secondary | ICD-10-CM | POA: Diagnosis not present

## 2019-05-17 DIAGNOSIS — Z20828 Contact with and (suspected) exposure to other viral communicable diseases: Secondary | ICD-10-CM | POA: Diagnosis not present

## 2019-05-17 DIAGNOSIS — Z1159 Encounter for screening for other viral diseases: Secondary | ICD-10-CM | POA: Diagnosis not present

## 2019-05-21 DIAGNOSIS — Z20828 Contact with and (suspected) exposure to other viral communicable diseases: Secondary | ICD-10-CM | POA: Diagnosis not present

## 2019-05-21 DIAGNOSIS — Z1159 Encounter for screening for other viral diseases: Secondary | ICD-10-CM | POA: Diagnosis not present

## 2019-05-24 DIAGNOSIS — Z1159 Encounter for screening for other viral diseases: Secondary | ICD-10-CM | POA: Diagnosis not present

## 2019-05-24 DIAGNOSIS — Z20828 Contact with and (suspected) exposure to other viral communicable diseases: Secondary | ICD-10-CM | POA: Diagnosis not present

## 2019-05-28 DIAGNOSIS — Z20828 Contact with and (suspected) exposure to other viral communicable diseases: Secondary | ICD-10-CM | POA: Diagnosis not present

## 2019-05-28 DIAGNOSIS — Z1159 Encounter for screening for other viral diseases: Secondary | ICD-10-CM | POA: Diagnosis not present

## 2019-05-31 DIAGNOSIS — F411 Generalized anxiety disorder: Secondary | ICD-10-CM | POA: Diagnosis not present

## 2019-05-31 DIAGNOSIS — Z20828 Contact with and (suspected) exposure to other viral communicable diseases: Secondary | ICD-10-CM | POA: Diagnosis not present

## 2019-05-31 DIAGNOSIS — Z1159 Encounter for screening for other viral diseases: Secondary | ICD-10-CM | POA: Diagnosis not present

## 2019-05-31 DIAGNOSIS — F331 Major depressive disorder, recurrent, moderate: Secondary | ICD-10-CM | POA: Diagnosis not present

## 2019-06-04 ENCOUNTER — Telehealth: Payer: Self-pay

## 2019-06-04 DIAGNOSIS — Z20828 Contact with and (suspected) exposure to other viral communicable diseases: Secondary | ICD-10-CM | POA: Diagnosis not present

## 2019-06-04 DIAGNOSIS — Z1159 Encounter for screening for other viral diseases: Secondary | ICD-10-CM | POA: Diagnosis not present

## 2019-06-04 NOTE — Telephone Encounter (Signed)
Left message we can see her on Thursday at 10:45am.

## 2019-06-04 NOTE — Telephone Encounter (Signed)
Patient's daughter called patient has noticed some blood stains on her underwear she doesn't know how long this has been going on she wants to know if she needs to bring her here or take her to the GYN? She wanted me to ask you.

## 2019-06-04 NOTE — Telephone Encounter (Signed)
Can see her later this week

## 2019-06-05 NOTE — Telephone Encounter (Signed)
Appointment scheduled.

## 2019-06-07 ENCOUNTER — Encounter: Payer: Self-pay | Admitting: Internal Medicine

## 2019-06-07 ENCOUNTER — Other Ambulatory Visit: Payer: Self-pay

## 2019-06-07 ENCOUNTER — Ambulatory Visit (INDEPENDENT_AMBULATORY_CARE_PROVIDER_SITE_OTHER): Payer: PPO | Admitting: Internal Medicine

## 2019-06-07 VITALS — BP 150/90 | HR 88 | Temp 98.3°F | Wt 143.0 lb

## 2019-06-07 DIAGNOSIS — B3749 Other urogenital candidiasis: Secondary | ICD-10-CM | POA: Diagnosis not present

## 2019-06-07 DIAGNOSIS — Z131 Encounter for screening for diabetes mellitus: Secondary | ICD-10-CM | POA: Diagnosis not present

## 2019-06-07 DIAGNOSIS — Z1329 Encounter for screening for other suspected endocrine disorder: Secondary | ICD-10-CM | POA: Diagnosis not present

## 2019-06-07 DIAGNOSIS — N3946 Mixed incontinence: Secondary | ICD-10-CM | POA: Diagnosis not present

## 2019-06-07 DIAGNOSIS — E559 Vitamin D deficiency, unspecified: Secondary | ICD-10-CM

## 2019-06-07 DIAGNOSIS — N909 Noninflammatory disorder of vulva and perineum, unspecified: Secondary | ICD-10-CM | POA: Diagnosis not present

## 2019-06-07 NOTE — Progress Notes (Signed)
   Subjective:    Patient ID: Kelly Irwin, female    DOB: [birth year], 86 y.o.   MRN: [id number]  HPI 86 year old Female brought in by daughter, Dr. Marylynn Pearson, for evaluation of genital irritation/rash.  Patient last seen here in April 2019.  Dr. Dillard Essex would like for her to have lab work.  She is not fasting so lipid panel not drawn.  She has a history of iron deficiency with iron level being 40 in April 2019.  Of course at her age we did not put her through an evaluation but simply placed her on some iron supplement.  History of mild anemia with last hemoglobin 10.9 g in 2019.    Review of Systems see above.  Dr. Dillard Essex found some streaks of blood in patient's underwear and was concerned she might have vaginal or GI bleeding.     Objective:   Physical Exam Blood pressure 150/90 BMI 29.89 pulse 88 temperature 98.3 pulse oximetry 93%.  Patient ambulates slowly with a walker.  Inspection of vagina shows no evidence of bleeding or tumor.  Rectal exam no palpable masses.  No blood on glove just dark stool.  She has considerable erythema over her pubic area extending into perineal area and buttock area.  Skin is bright red in this area with no pustules.       Assessment & Plan:  Candida dermatitis in perineal area-recommend Nystatin powder instead of what ever ointment that is currently being used.  I feel it is important to keep the area dry.  Keeping her dry is hard because she suffers from urinary incontinence.  Health maintenance-nonfasting lab work drawn.  Lipid panel not drawn.  History of anemia-CBC drawn today.  She is on iron medication.

## 2019-06-07 NOTE — Patient Instructions (Signed)
Nonfasting labs drawn and pending.  Nystatin powder to perineal area twice daily.

## 2019-06-08 ENCOUNTER — Telehealth: Payer: Self-pay | Admitting: Internal Medicine

## 2019-06-08 DIAGNOSIS — E039 Hypothyroidism, unspecified: Secondary | ICD-10-CM

## 2019-06-08 LAB — HEMOGLOBIN A1C
Hgb A1c MFr Bld: 5.9 % of total Hgb — ABNORMAL HIGH (ref <5.7)
Mean Plasma Glucose: 123 (calc)
eAG (mmol/L): 6.8 (calc)

## 2019-06-08 LAB — CBC WITH DIFFERENTIAL/PLATELET
Absolute Monocytes: 970 cells/uL — ABNORMAL HIGH (ref 200–950)
Basophils Absolute: 107 cells/uL (ref 0–200)
Basophils Relative: 1.1 %
Eosinophils Absolute: 310 cells/uL (ref 15–500)
Eosinophils Relative: 3.2 %
HCT: 37.5 % (ref 35.0–45.0)
Hemoglobin: 12.4 g/dL (ref 11.7–15.5)
Lymphs Abs: 1775 cells/uL (ref 850–3900)
MCH: 28 pg (ref 27.0–33.0)
MCHC: 33.1 g/dL (ref 32.0–36.0)
MCV: 84.7 fL (ref 80.0–100.0)
MPV: 9.5 fL (ref 7.5–12.5)
Monocytes Relative: 10 %
Neutro Abs: 6538 cells/uL (ref 1500–7800)
Neutrophils Relative %: 67.4 %
Platelets: 253 10*3/uL (ref 140–400)
RBC: 4.43 10*6/uL (ref 3.80–5.10)
RDW: 13.6 % (ref 11.0–15.0)
Total Lymphocyte: 18.3 %
WBC: 9.7 10*3/uL (ref 3.8–10.8)

## 2019-06-08 LAB — COMPLETE METABOLIC PANEL WITH GFR
AG Ratio: 1.8 (calc) (ref 1.0–2.5)
ALT: 16 U/L (ref 6–29)
AST: 20 U/L (ref 10–35)
Albumin: 4.2 g/dL (ref 3.6–5.1)
Alkaline phosphatase (APISO): 62 U/L (ref 37–153)
BUN/Creatinine Ratio: 14 (calc) (ref 6–22)
BUN: 16 mg/dL (ref 7–25)
CO2: 30 mmol/L (ref 20–32)
Calcium: 9.6 mg/dL (ref 8.6–10.4)
Chloride: 97 mmol/L — ABNORMAL LOW (ref 98–110)
Creat: 1.13 mg/dL — ABNORMAL HIGH (ref 0.60–0.88)
GFR, Est African American: 50 mL/min/{1.73_m2} — ABNORMAL LOW (ref >=60)
GFR, Est Non African American: 43 mL/min/{1.73_m2} — ABNORMAL LOW (ref >=60)
Globulin: 2.4 g/dL (calc) (ref 1.9–3.7)
Glucose, Bld: 73 mg/dL (ref 65–99)
Potassium: 4.6 mmol/L (ref 3.5–5.3)
Sodium: 136 mmol/L (ref 135–146)
Total Bilirubin: 0.4 mg/dL (ref 0.2–1.2)
Total Protein: 6.6 g/dL (ref 6.1–8.1)

## 2019-06-08 LAB — VITAMIN D 25 HYDROXY (VIT D DEFICIENCY, FRACTURES): Vit D, 25-Hydroxy: 30 ng/mL (ref 30–100)

## 2019-06-08 LAB — TSH: TSH: 0.11 mIU/L — ABNORMAL LOW (ref 0.40–4.50)

## 2019-06-08 NOTE — Telephone Encounter (Signed)
Faxed prescription to [facility] and order to redraw TSH labs in 6 weeks and fax results to Dr Renold Genta.

## 2019-06-11 DIAGNOSIS — Z1159 Encounter for screening for other viral diseases: Secondary | ICD-10-CM | POA: Diagnosis not present

## 2019-06-11 DIAGNOSIS — Z20828 Contact with and (suspected) exposure to other viral communicable diseases: Secondary | ICD-10-CM | POA: Diagnosis not present

## 2019-06-14 DIAGNOSIS — Z20828 Contact with and (suspected) exposure to other viral communicable diseases: Secondary | ICD-10-CM | POA: Diagnosis not present

## 2019-06-14 DIAGNOSIS — Z1159 Encounter for screening for other viral diseases: Secondary | ICD-10-CM | POA: Diagnosis not present

## 2019-06-14 DIAGNOSIS — F331 Major depressive disorder, recurrent, moderate: Secondary | ICD-10-CM | POA: Diagnosis not present

## 2019-06-14 DIAGNOSIS — F411 Generalized anxiety disorder: Secondary | ICD-10-CM | POA: Diagnosis not present

## 2019-06-18 DIAGNOSIS — Z20828 Contact with and (suspected) exposure to other viral communicable diseases: Secondary | ICD-10-CM | POA: Diagnosis not present

## 2019-06-18 DIAGNOSIS — Z1159 Encounter for screening for other viral diseases: Secondary | ICD-10-CM | POA: Diagnosis not present

## 2019-06-21 DIAGNOSIS — Z1159 Encounter for screening for other viral diseases: Secondary | ICD-10-CM | POA: Diagnosis not present

## 2019-06-21 DIAGNOSIS — Z20828 Contact with and (suspected) exposure to other viral communicable diseases: Secondary | ICD-10-CM | POA: Diagnosis not present

## 2019-06-25 DIAGNOSIS — Z1159 Encounter for screening for other viral diseases: Secondary | ICD-10-CM | POA: Diagnosis not present

## 2019-06-25 DIAGNOSIS — Z20828 Contact with and (suspected) exposure to other viral communicable diseases: Secondary | ICD-10-CM | POA: Diagnosis not present

## 2019-06-28 DIAGNOSIS — Z1159 Encounter for screening for other viral diseases: Secondary | ICD-10-CM | POA: Diagnosis not present

## 2019-06-28 DIAGNOSIS — Z20828 Contact with and (suspected) exposure to other viral communicable diseases: Secondary | ICD-10-CM | POA: Diagnosis not present

## 2019-06-28 DIAGNOSIS — F331 Major depressive disorder, recurrent, moderate: Secondary | ICD-10-CM | POA: Diagnosis not present

## 2019-06-28 DIAGNOSIS — F411 Generalized anxiety disorder: Secondary | ICD-10-CM | POA: Diagnosis not present

## 2019-07-02 DIAGNOSIS — Z20828 Contact with and (suspected) exposure to other viral communicable diseases: Secondary | ICD-10-CM | POA: Diagnosis not present

## 2019-07-02 DIAGNOSIS — Z1159 Encounter for screening for other viral diseases: Secondary | ICD-10-CM | POA: Diagnosis not present

## 2019-07-05 DIAGNOSIS — Z20828 Contact with and (suspected) exposure to other viral communicable diseases: Secondary | ICD-10-CM | POA: Diagnosis not present

## 2019-07-05 DIAGNOSIS — Z1159 Encounter for screening for other viral diseases: Secondary | ICD-10-CM | POA: Diagnosis not present

## 2019-07-06 DIAGNOSIS — I10 Essential (primary) hypertension: Secondary | ICD-10-CM | POA: Diagnosis not present

## 2019-07-06 DIAGNOSIS — J42 Unspecified chronic bronchitis: Secondary | ICD-10-CM | POA: Diagnosis not present

## 2019-07-06 DIAGNOSIS — G894 Chronic pain syndrome: Secondary | ICD-10-CM | POA: Diagnosis not present

## 2019-07-06 DIAGNOSIS — R600 Localized edema: Secondary | ICD-10-CM | POA: Diagnosis not present

## 2019-07-09 ENCOUNTER — Telehealth: Payer: Self-pay | Admitting: Internal Medicine

## 2019-07-09 DIAGNOSIS — Z20828 Contact with and (suspected) exposure to other viral communicable diseases: Secondary | ICD-10-CM | POA: Diagnosis not present

## 2019-07-09 DIAGNOSIS — Z1159 Encounter for screening for other viral diseases: Secondary | ICD-10-CM | POA: Diagnosis not present

## 2019-07-09 NOTE — Telephone Encounter (Signed)
Faxed patients lab results to Dr Dillard Essex so she can give them to doctors office that is handling patients care at [facility].

## 2019-07-12 DIAGNOSIS — Z1159 Encounter for screening for other viral diseases: Secondary | ICD-10-CM | POA: Diagnosis not present

## 2019-07-12 DIAGNOSIS — F331 Major depressive disorder, recurrent, moderate: Secondary | ICD-10-CM | POA: Diagnosis not present

## 2019-07-12 DIAGNOSIS — F411 Generalized anxiety disorder: Secondary | ICD-10-CM | POA: Diagnosis not present

## 2019-07-12 DIAGNOSIS — Z20828 Contact with and (suspected) exposure to other viral communicable diseases: Secondary | ICD-10-CM | POA: Diagnosis not present

## 2019-07-16 DIAGNOSIS — Z1159 Encounter for screening for other viral diseases: Secondary | ICD-10-CM | POA: Diagnosis not present

## 2019-07-16 DIAGNOSIS — Z20828 Contact with and (suspected) exposure to other viral communicable diseases: Secondary | ICD-10-CM | POA: Diagnosis not present

## 2019-07-19 DIAGNOSIS — Z1159 Encounter for screening for other viral diseases: Secondary | ICD-10-CM | POA: Diagnosis not present

## 2019-07-19 DIAGNOSIS — Z20828 Contact with and (suspected) exposure to other viral communicable diseases: Secondary | ICD-10-CM | POA: Diagnosis not present

## 2019-07-20 DIAGNOSIS — R946 Abnormal results of thyroid function studies: Secondary | ICD-10-CM | POA: Diagnosis not present

## 2019-07-23 DIAGNOSIS — Z1159 Encounter for screening for other viral diseases: Secondary | ICD-10-CM | POA: Diagnosis not present

## 2019-07-23 DIAGNOSIS — Z20828 Contact with and (suspected) exposure to other viral communicable diseases: Secondary | ICD-10-CM | POA: Diagnosis not present

## 2019-07-24 ENCOUNTER — Telehealth (INDEPENDENT_AMBULATORY_CARE_PROVIDER_SITE_OTHER): Payer: PPO | Admitting: Internal Medicine

## 2019-07-24 DIAGNOSIS — E039 Hypothyroidism, unspecified: Secondary | ICD-10-CM

## 2019-07-24 NOTE — Telephone Encounter (Addendum)
Patient has hx of hypothyroidism TSH result  received from [facility] is  low at 0.26 on current dose of Synthroid 50 mcgs daily. Decrease Synthroid to 25 mcgs by mouth daily. Fax new order after reviewing chart to [facility] for Synthroid 25 mcgs daily and repeat TSH in 6 weeks.  Time spent reviewing chart, communicating plan to [facility], checking current dose of Synthroid with [facility] staff by phone, and medical decision making is 10 minutes

## 2019-07-26 DIAGNOSIS — Z20828 Contact with and (suspected) exposure to other viral communicable diseases: Secondary | ICD-10-CM | POA: Diagnosis not present

## 2019-07-26 DIAGNOSIS — F411 Generalized anxiety disorder: Secondary | ICD-10-CM | POA: Diagnosis not present

## 2019-07-26 DIAGNOSIS — F331 Major depressive disorder, recurrent, moderate: Secondary | ICD-10-CM | POA: Diagnosis not present

## 2019-07-26 DIAGNOSIS — Z1159 Encounter for screening for other viral diseases: Secondary | ICD-10-CM | POA: Diagnosis not present

## 2019-07-27 ENCOUNTER — Other Ambulatory Visit: Payer: Self-pay | Admitting: Internal Medicine

## 2019-07-30 DIAGNOSIS — Z1159 Encounter for screening for other viral diseases: Secondary | ICD-10-CM | POA: Diagnosis not present

## 2019-07-30 DIAGNOSIS — Z20828 Contact with and (suspected) exposure to other viral communicable diseases: Secondary | ICD-10-CM | POA: Diagnosis not present

## 2019-08-02 DIAGNOSIS — Z20828 Contact with and (suspected) exposure to other viral communicable diseases: Secondary | ICD-10-CM | POA: Diagnosis not present

## 2019-08-02 DIAGNOSIS — Z1159 Encounter for screening for other viral diseases: Secondary | ICD-10-CM | POA: Diagnosis not present

## 2019-08-06 DIAGNOSIS — F331 Major depressive disorder, recurrent, moderate: Secondary | ICD-10-CM | POA: Diagnosis not present

## 2019-08-06 DIAGNOSIS — Z1159 Encounter for screening for other viral diseases: Secondary | ICD-10-CM | POA: Diagnosis not present

## 2019-08-06 DIAGNOSIS — Z20828 Contact with and (suspected) exposure to other viral communicable diseases: Secondary | ICD-10-CM | POA: Diagnosis not present

## 2019-08-06 DIAGNOSIS — F411 Generalized anxiety disorder: Secondary | ICD-10-CM | POA: Diagnosis not present

## 2019-08-09 DIAGNOSIS — Z20828 Contact with and (suspected) exposure to other viral communicable diseases: Secondary | ICD-10-CM | POA: Diagnosis not present

## 2019-08-09 DIAGNOSIS — Z1159 Encounter for screening for other viral diseases: Secondary | ICD-10-CM | POA: Diagnosis not present

## 2019-08-13 DIAGNOSIS — Z20828 Contact with and (suspected) exposure to other viral communicable diseases: Secondary | ICD-10-CM | POA: Diagnosis not present

## 2019-08-13 DIAGNOSIS — Z1159 Encounter for screening for other viral diseases: Secondary | ICD-10-CM | POA: Diagnosis not present

## 2019-08-16 DIAGNOSIS — Z1159 Encounter for screening for other viral diseases: Secondary | ICD-10-CM | POA: Diagnosis not present

## 2019-08-16 DIAGNOSIS — Z20828 Contact with and (suspected) exposure to other viral communicable diseases: Secondary | ICD-10-CM | POA: Diagnosis not present

## 2019-08-23 DIAGNOSIS — F331 Major depressive disorder, recurrent, moderate: Secondary | ICD-10-CM | POA: Diagnosis not present

## 2019-08-23 DIAGNOSIS — Z1159 Encounter for screening for other viral diseases: Secondary | ICD-10-CM | POA: Diagnosis not present

## 2019-08-23 DIAGNOSIS — Z20828 Contact with and (suspected) exposure to other viral communicable diseases: Secondary | ICD-10-CM | POA: Diagnosis not present

## 2019-08-23 DIAGNOSIS — F411 Generalized anxiety disorder: Secondary | ICD-10-CM | POA: Diagnosis not present

## 2019-08-27 DIAGNOSIS — Z20828 Contact with and (suspected) exposure to other viral communicable diseases: Secondary | ICD-10-CM | POA: Diagnosis not present

## 2019-08-27 DIAGNOSIS — Z1159 Encounter for screening for other viral diseases: Secondary | ICD-10-CM | POA: Diagnosis not present

## 2019-08-29 ENCOUNTER — Telehealth (INDEPENDENT_AMBULATORY_CARE_PROVIDER_SITE_OTHER): Payer: PPO | Admitting: Internal Medicine

## 2019-08-29 DIAGNOSIS — E039 Hypothyroidism, unspecified: Secondary | ICD-10-CM | POA: Diagnosis not present

## 2019-08-29 NOTE — Telephone Encounter (Signed)
[facility] faxed TSH result. Currently on Synthroid 25 mcgs daily. Dose had been reduced in May due to low TSH on 50 mcgs daily. TSH is now normal at 0.95. Orders sent to [facility] to continue this same dose. Time spent reviewing records, result, medical decision making and faxing orders personally to [facility] is 15 minutes. MJB, MD

## 2019-08-30 DIAGNOSIS — Z20828 Contact with and (suspected) exposure to other viral communicable diseases: Secondary | ICD-10-CM | POA: Diagnosis not present

## 2019-08-30 DIAGNOSIS — Z1159 Encounter for screening for other viral diseases: Secondary | ICD-10-CM | POA: Diagnosis not present

## 2019-09-03 DIAGNOSIS — Z1159 Encounter for screening for other viral diseases: Secondary | ICD-10-CM | POA: Diagnosis not present

## 2019-09-03 DIAGNOSIS — Z20828 Contact with and (suspected) exposure to other viral communicable diseases: Secondary | ICD-10-CM | POA: Diagnosis not present

## 2019-09-06 DIAGNOSIS — Z1159 Encounter for screening for other viral diseases: Secondary | ICD-10-CM | POA: Diagnosis not present

## 2019-09-06 DIAGNOSIS — Z20828 Contact with and (suspected) exposure to other viral communicable diseases: Secondary | ICD-10-CM | POA: Diagnosis not present

## 2019-09-10 DIAGNOSIS — Z1159 Encounter for screening for other viral diseases: Secondary | ICD-10-CM | POA: Diagnosis not present

## 2019-09-10 DIAGNOSIS — Z20828 Contact with and (suspected) exposure to other viral communicable diseases: Secondary | ICD-10-CM | POA: Diagnosis not present

## 2019-09-13 DIAGNOSIS — Z1159 Encounter for screening for other viral diseases: Secondary | ICD-10-CM | POA: Diagnosis not present

## 2019-09-13 DIAGNOSIS — L602 Onychogryphosis: Secondary | ICD-10-CM | POA: Diagnosis not present

## 2019-09-13 DIAGNOSIS — I739 Peripheral vascular disease, unspecified: Secondary | ICD-10-CM | POA: Diagnosis not present

## 2019-09-13 DIAGNOSIS — M21962 Unspecified acquired deformity of left lower leg: Secondary | ICD-10-CM | POA: Diagnosis not present

## 2019-09-13 DIAGNOSIS — Z20828 Contact with and (suspected) exposure to other viral communicable diseases: Secondary | ICD-10-CM | POA: Diagnosis not present

## 2019-09-13 DIAGNOSIS — M2042 Other hammer toe(s) (acquired), left foot: Secondary | ICD-10-CM | POA: Diagnosis not present

## 2019-09-13 DIAGNOSIS — M21961 Unspecified acquired deformity of right lower leg: Secondary | ICD-10-CM | POA: Diagnosis not present

## 2019-09-13 DIAGNOSIS — M792 Neuralgia and neuritis, unspecified: Secondary | ICD-10-CM | POA: Diagnosis not present

## 2019-09-13 DIAGNOSIS — L84 Corns and callosities: Secondary | ICD-10-CM | POA: Diagnosis not present

## 2019-09-17 DIAGNOSIS — Z20828 Contact with and (suspected) exposure to other viral communicable diseases: Secondary | ICD-10-CM | POA: Diagnosis not present

## 2019-09-17 DIAGNOSIS — Z1159 Encounter for screening for other viral diseases: Secondary | ICD-10-CM | POA: Diagnosis not present

## 2019-09-19 DIAGNOSIS — F411 Generalized anxiety disorder: Secondary | ICD-10-CM | POA: Diagnosis not present

## 2019-09-19 DIAGNOSIS — F331 Major depressive disorder, recurrent, moderate: Secondary | ICD-10-CM | POA: Diagnosis not present

## 2019-09-22 DIAGNOSIS — I10 Essential (primary) hypertension: Secondary | ICD-10-CM | POA: Diagnosis not present

## 2019-09-22 DIAGNOSIS — G894 Chronic pain syndrome: Secondary | ICD-10-CM | POA: Diagnosis not present

## 2019-09-22 DIAGNOSIS — M6281 Muscle weakness (generalized): Secondary | ICD-10-CM | POA: Diagnosis not present

## 2019-09-22 DIAGNOSIS — B372 Candidiasis of skin and nail: Secondary | ICD-10-CM | POA: Diagnosis not present

## 2019-09-24 DIAGNOSIS — Z20828 Contact with and (suspected) exposure to other viral communicable diseases: Secondary | ICD-10-CM | POA: Diagnosis not present

## 2019-09-24 DIAGNOSIS — Z1159 Encounter for screening for other viral diseases: Secondary | ICD-10-CM | POA: Diagnosis not present

## 2019-09-27 DIAGNOSIS — Z20828 Contact with and (suspected) exposure to other viral communicable diseases: Secondary | ICD-10-CM | POA: Diagnosis not present

## 2019-09-27 DIAGNOSIS — Z1159 Encounter for screening for other viral diseases: Secondary | ICD-10-CM | POA: Diagnosis not present

## 2019-10-01 DIAGNOSIS — Z20828 Contact with and (suspected) exposure to other viral communicable diseases: Secondary | ICD-10-CM | POA: Diagnosis not present

## 2019-10-01 DIAGNOSIS — Z1159 Encounter for screening for other viral diseases: Secondary | ICD-10-CM | POA: Diagnosis not present

## 2019-10-04 DIAGNOSIS — Z1159 Encounter for screening for other viral diseases: Secondary | ICD-10-CM | POA: Diagnosis not present

## 2019-10-04 DIAGNOSIS — F331 Major depressive disorder, recurrent, moderate: Secondary | ICD-10-CM | POA: Diagnosis not present

## 2019-10-04 DIAGNOSIS — F411 Generalized anxiety disorder: Secondary | ICD-10-CM | POA: Diagnosis not present

## 2019-10-04 DIAGNOSIS — Z20828 Contact with and (suspected) exposure to other viral communicable diseases: Secondary | ICD-10-CM | POA: Diagnosis not present

## 2019-10-08 DIAGNOSIS — Z20828 Contact with and (suspected) exposure to other viral communicable diseases: Secondary | ICD-10-CM | POA: Diagnosis not present

## 2019-10-08 DIAGNOSIS — Z1159 Encounter for screening for other viral diseases: Secondary | ICD-10-CM | POA: Diagnosis not present

## 2019-10-11 ENCOUNTER — Other Ambulatory Visit: Payer: Self-pay | Admitting: Internal Medicine

## 2019-10-11 DIAGNOSIS — Z20828 Contact with and (suspected) exposure to other viral communicable diseases: Secondary | ICD-10-CM | POA: Diagnosis not present

## 2019-10-11 DIAGNOSIS — Z1159 Encounter for screening for other viral diseases: Secondary | ICD-10-CM | POA: Diagnosis not present

## 2019-10-15 DIAGNOSIS — Z1159 Encounter for screening for other viral diseases: Secondary | ICD-10-CM | POA: Diagnosis not present

## 2019-10-15 DIAGNOSIS — Z20828 Contact with and (suspected) exposure to other viral communicable diseases: Secondary | ICD-10-CM | POA: Diagnosis not present

## 2019-10-25 DIAGNOSIS — F331 Major depressive disorder, recurrent, moderate: Secondary | ICD-10-CM | POA: Diagnosis not present

## 2019-10-25 DIAGNOSIS — F411 Generalized anxiety disorder: Secondary | ICD-10-CM | POA: Diagnosis not present

## 2019-10-25 DIAGNOSIS — Z20828 Contact with and (suspected) exposure to other viral communicable diseases: Secondary | ICD-10-CM | POA: Diagnosis not present

## 2019-10-25 DIAGNOSIS — Z1159 Encounter for screening for other viral diseases: Secondary | ICD-10-CM | POA: Diagnosis not present

## 2019-10-27 DIAGNOSIS — G894 Chronic pain syndrome: Secondary | ICD-10-CM | POA: Diagnosis not present

## 2019-10-27 DIAGNOSIS — B372 Candidiasis of skin and nail: Secondary | ICD-10-CM | POA: Diagnosis not present

## 2019-10-27 DIAGNOSIS — M6281 Muscle weakness (generalized): Secondary | ICD-10-CM | POA: Diagnosis not present

## 2019-10-27 DIAGNOSIS — I10 Essential (primary) hypertension: Secondary | ICD-10-CM | POA: Diagnosis not present

## 2019-10-27 DIAGNOSIS — F419 Anxiety disorder, unspecified: Secondary | ICD-10-CM | POA: Diagnosis not present

## 2019-10-29 DIAGNOSIS — Z1159 Encounter for screening for other viral diseases: Secondary | ICD-10-CM | POA: Diagnosis not present

## 2019-10-29 DIAGNOSIS — Z20828 Contact with and (suspected) exposure to other viral communicable diseases: Secondary | ICD-10-CM | POA: Diagnosis not present

## 2019-11-01 DIAGNOSIS — Z1159 Encounter for screening for other viral diseases: Secondary | ICD-10-CM | POA: Diagnosis not present

## 2019-11-01 DIAGNOSIS — Z20828 Contact with and (suspected) exposure to other viral communicable diseases: Secondary | ICD-10-CM | POA: Diagnosis not present

## 2019-11-06 DIAGNOSIS — Z20828 Contact with and (suspected) exposure to other viral communicable diseases: Secondary | ICD-10-CM | POA: Diagnosis not present

## 2019-11-06 DIAGNOSIS — Z1159 Encounter for screening for other viral diseases: Secondary | ICD-10-CM | POA: Diagnosis not present

## 2019-11-08 DIAGNOSIS — Z1159 Encounter for screening for other viral diseases: Secondary | ICD-10-CM | POA: Diagnosis not present

## 2019-11-08 DIAGNOSIS — Z20828 Contact with and (suspected) exposure to other viral communicable diseases: Secondary | ICD-10-CM | POA: Diagnosis not present

## 2019-11-12 DIAGNOSIS — Z1159 Encounter for screening for other viral diseases: Secondary | ICD-10-CM | POA: Diagnosis not present

## 2019-11-12 DIAGNOSIS — Z20828 Contact with and (suspected) exposure to other viral communicable diseases: Secondary | ICD-10-CM | POA: Diagnosis not present

## 2019-11-15 DIAGNOSIS — Z20828 Contact with and (suspected) exposure to other viral communicable diseases: Secondary | ICD-10-CM | POA: Diagnosis not present

## 2019-11-15 DIAGNOSIS — Z1159 Encounter for screening for other viral diseases: Secondary | ICD-10-CM | POA: Diagnosis not present

## 2019-11-19 DIAGNOSIS — Z20828 Contact with and (suspected) exposure to other viral communicable diseases: Secondary | ICD-10-CM | POA: Diagnosis not present

## 2019-11-19 DIAGNOSIS — Z1159 Encounter for screening for other viral diseases: Secondary | ICD-10-CM | POA: Diagnosis not present

## 2019-11-22 DIAGNOSIS — Z20828 Contact with and (suspected) exposure to other viral communicable diseases: Secondary | ICD-10-CM | POA: Diagnosis not present

## 2019-11-22 DIAGNOSIS — Z1159 Encounter for screening for other viral diseases: Secondary | ICD-10-CM | POA: Diagnosis not present

## 2019-11-26 DIAGNOSIS — Z1159 Encounter for screening for other viral diseases: Secondary | ICD-10-CM | POA: Diagnosis not present

## 2019-11-26 DIAGNOSIS — Z20828 Contact with and (suspected) exposure to other viral communicable diseases: Secondary | ICD-10-CM | POA: Diagnosis not present

## 2019-11-29 DIAGNOSIS — Z1159 Encounter for screening for other viral diseases: Secondary | ICD-10-CM | POA: Diagnosis not present

## 2019-11-29 DIAGNOSIS — Z20828 Contact with and (suspected) exposure to other viral communicable diseases: Secondary | ICD-10-CM | POA: Diagnosis not present

## 2019-12-03 DIAGNOSIS — Z20828 Contact with and (suspected) exposure to other viral communicable diseases: Secondary | ICD-10-CM | POA: Diagnosis not present

## 2019-12-03 DIAGNOSIS — Z1159 Encounter for screening for other viral diseases: Secondary | ICD-10-CM | POA: Diagnosis not present

## 2019-12-06 DIAGNOSIS — Z20828 Contact with and (suspected) exposure to other viral communicable diseases: Secondary | ICD-10-CM | POA: Diagnosis not present

## 2019-12-06 DIAGNOSIS — Z1159 Encounter for screening for other viral diseases: Secondary | ICD-10-CM | POA: Diagnosis not present

## 2019-12-10 DIAGNOSIS — Z20828 Contact with and (suspected) exposure to other viral communicable diseases: Secondary | ICD-10-CM | POA: Diagnosis not present

## 2019-12-10 DIAGNOSIS — Z1159 Encounter for screening for other viral diseases: Secondary | ICD-10-CM | POA: Diagnosis not present

## 2019-12-13 DIAGNOSIS — Z20828 Contact with and (suspected) exposure to other viral communicable diseases: Secondary | ICD-10-CM | POA: Diagnosis not present

## 2019-12-13 DIAGNOSIS — Z1159 Encounter for screening for other viral diseases: Secondary | ICD-10-CM | POA: Diagnosis not present

## 2019-12-17 DIAGNOSIS — Z20828 Contact with and (suspected) exposure to other viral communicable diseases: Secondary | ICD-10-CM | POA: Diagnosis not present

## 2019-12-17 DIAGNOSIS — Z1159 Encounter for screening for other viral diseases: Secondary | ICD-10-CM | POA: Diagnosis not present

## 2020-01-14 DEATH — deceased
